# Patient Record
Sex: Female | Born: 1935 | Race: White | Hispanic: No | Marital: Married | Smoking: Never smoker
Health system: Southern US, Community
[De-identification: ages and names within clinical notes are randomized; demographics above are authoritative.]

## PROBLEM LIST (undated history)

## (undated) DIAGNOSIS — I10 Essential (primary) hypertension: Secondary | ICD-10-CM

## (undated) DIAGNOSIS — K219 Gastro-esophageal reflux disease without esophagitis: Secondary | ICD-10-CM

## (undated) DIAGNOSIS — I4510 Unspecified right bundle-branch block: Secondary | ICD-10-CM

## (undated) DIAGNOSIS — K819 Cholecystitis, unspecified: Secondary | ICD-10-CM

---

## 1998-01-30 ENCOUNTER — Other Ambulatory Visit: Admission: RE | Admit: 1998-01-30 | Discharge: 1998-01-30 | Payer: Self-pay | Admitting: Emergency Medicine

## 1999-05-24 ENCOUNTER — Encounter: Admission: RE | Admit: 1999-05-24 | Discharge: 1999-05-24 | Payer: Self-pay | Admitting: Emergency Medicine

## 1999-10-13 ENCOUNTER — Encounter: Payer: Self-pay | Admitting: Emergency Medicine

## 1999-10-13 ENCOUNTER — Encounter: Admission: RE | Admit: 1999-10-13 | Discharge: 1999-10-13 | Payer: Self-pay | Admitting: Emergency Medicine

## 2000-11-28 ENCOUNTER — Encounter: Payer: Self-pay | Admitting: Emergency Medicine

## 2000-11-28 ENCOUNTER — Encounter: Admission: RE | Admit: 2000-11-28 | Discharge: 2000-11-28 | Payer: Self-pay | Admitting: Emergency Medicine

## 2001-12-08 ENCOUNTER — Encounter: Payer: Self-pay | Admitting: Emergency Medicine

## 2001-12-08 ENCOUNTER — Encounter: Admission: RE | Admit: 2001-12-08 | Discharge: 2001-12-08 | Payer: Self-pay | Admitting: Emergency Medicine

## 2002-03-13 ENCOUNTER — Encounter: Admission: RE | Admit: 2002-03-13 | Discharge: 2002-03-13 | Payer: Self-pay | Admitting: Emergency Medicine

## 2002-03-13 ENCOUNTER — Encounter: Payer: Self-pay | Admitting: Emergency Medicine

## 2003-02-04 ENCOUNTER — Encounter: Payer: Self-pay | Admitting: Emergency Medicine

## 2003-02-04 ENCOUNTER — Encounter: Admission: RE | Admit: 2003-02-04 | Discharge: 2003-02-04 | Payer: Self-pay | Admitting: Emergency Medicine

## 2004-03-06 ENCOUNTER — Encounter: Admission: RE | Admit: 2004-03-06 | Discharge: 2004-03-06 | Payer: Self-pay | Admitting: Emergency Medicine

## 2004-10-25 ENCOUNTER — Encounter (INDEPENDENT_AMBULATORY_CARE_PROVIDER_SITE_OTHER): Payer: Self-pay | Admitting: *Deleted

## 2004-10-26 ENCOUNTER — Ambulatory Visit (HOSPITAL_COMMUNITY): Admission: RE | Admit: 2004-10-26 | Discharge: 2004-10-26 | Payer: Self-pay | Admitting: Gastroenterology

## 2005-04-11 ENCOUNTER — Encounter: Admission: RE | Admit: 2005-04-11 | Discharge: 2005-04-11 | Payer: Self-pay | Admitting: Emergency Medicine

## 2006-03-22 ENCOUNTER — Encounter: Admission: RE | Admit: 2006-03-22 | Discharge: 2006-03-22 | Payer: Self-pay | Admitting: Emergency Medicine

## 2006-04-12 ENCOUNTER — Encounter: Admission: RE | Admit: 2006-04-12 | Discharge: 2006-04-12 | Payer: Self-pay | Admitting: Emergency Medicine

## 2007-05-02 ENCOUNTER — Encounter: Admission: RE | Admit: 2007-05-02 | Discharge: 2007-05-02 | Payer: Self-pay | Admitting: Emergency Medicine

## 2008-05-18 ENCOUNTER — Encounter: Admission: RE | Admit: 2008-05-18 | Discharge: 2008-05-18 | Payer: Self-pay | Admitting: Emergency Medicine

## 2009-02-16 ENCOUNTER — Encounter: Admission: RE | Admit: 2009-02-16 | Discharge: 2009-02-16 | Payer: Self-pay | Admitting: Emergency Medicine

## 2009-04-01 ENCOUNTER — Encounter: Admission: RE | Admit: 2009-04-01 | Discharge: 2009-04-01 | Payer: Self-pay | Admitting: Emergency Medicine

## 2009-05-31 ENCOUNTER — Encounter: Admission: RE | Admit: 2009-05-31 | Discharge: 2009-05-31 | Payer: Self-pay | Admitting: Emergency Medicine

## 2010-06-02 ENCOUNTER — Encounter: Admission: RE | Admit: 2010-06-02 | Discharge: 2010-06-02 | Payer: Self-pay | Admitting: Emergency Medicine

## 2010-06-19 ENCOUNTER — Encounter: Admission: RE | Admit: 2010-06-19 | Discharge: 2010-06-19 | Payer: Self-pay | Admitting: Emergency Medicine

## 2010-12-29 NOTE — Op Note (Signed)
Madeline Rodriguez, Madeline Rodriguez NO.:  [id number]   MEDICAL RECORD NO.:  [id number]          PATIENT TYPE:  AMB   LOCATION:  ENDO                         FACILITY:  [facility]   PHYSICIAN:  Graylin Shiver, M.D.   DATE OF BIRTH:  [birth year]   DATE OF PROCEDURE:  10/26/2004  DATE OF DISCHARGE:                                 OPERATIVE REPORT   PROCEDURE PERFORMED:  Colonoscopy with polypectomy.   INDICATIONS FOR PROCEDURE:  Rectal bleeding, family history of colon cancer.   Informed consent was obtained after explanation of the risks of bleeding,  infection and perforation.   PREMEDICATION:  Fentanyl 60 mcg IV, Versed 6 milligrams IV.   PROCEDURE:  With the patient in a left lateral decubitus position, a rectal  exam was performed. No masses were felt. The Olympus colonoscope was  inserted into the rectum and advanced around the colon to the cecum. Cecal  landmarks were identified. The cecum and descending colon were normal.  The  transverse colon was normal. The descending colon and sigmoid revealed  diverticulosis.  In the distal rectum there were two polyps.  One was a 3 mm  polyp removed with the cold snare technique. The other was 1 cm pedunculated  polyp removed by snare cautery technique. The scope was retroflexed in the  rectum.  No other abnormalities were seen.  She tolerated the procedure well  without complications.   IMPRESSION:  1.  Two rectal polyps.  2.  Diverticulosis.   PLAN:  The pathology will be checked.      SFG/MEDQ  D:  10/26/2004  T:  10/26/2004  Job:  [id number]   cc:   Reuben Likes, M.D.  [address]  [place]  [place] [address]  Fax: [phone]

## 2011-05-02 ENCOUNTER — Other Ambulatory Visit: Payer: Self-pay | Admitting: Emergency Medicine

## 2011-05-02 DIAGNOSIS — Z1231 Encounter for screening mammogram for malignant neoplasm of breast: Secondary | ICD-10-CM

## 2011-06-05 ENCOUNTER — Ambulatory Visit
Admission: RE | Admit: 2011-06-05 | Discharge: 2011-06-05 | Disposition: A | Discharge status: Home / Self Care | Payer: Federal, State, Local not specified - PPO | Source: Ambulatory Visit | Attending: Emergency Medicine | Admitting: Emergency Medicine

## 2011-06-05 DIAGNOSIS — Z1231 Encounter for screening mammogram for malignant neoplasm of breast: Secondary | ICD-10-CM

## 2012-05-05 ENCOUNTER — Other Ambulatory Visit: Payer: Self-pay | Admitting: Family Medicine

## 2012-05-05 DIAGNOSIS — Z1231 Encounter for screening mammogram for malignant neoplasm of breast: Secondary | ICD-10-CM

## 2012-06-05 ENCOUNTER — Ambulatory Visit
Admission: RE | Admit: 2012-06-05 | Discharge: 2012-06-05 | Disposition: A | Discharge status: Home / Self Care | Payer: Federal, State, Local not specified - PPO | Source: Ambulatory Visit | Attending: Family Medicine | Admitting: Family Medicine

## 2012-06-05 DIAGNOSIS — Z1231 Encounter for screening mammogram for malignant neoplasm of breast: Secondary | ICD-10-CM

## 2013-01-16 ENCOUNTER — Other Ambulatory Visit: Payer: Self-pay | Admitting: Family Medicine

## 2013-01-16 DIAGNOSIS — R42 Dizziness and giddiness: Secondary | ICD-10-CM

## 2013-01-20 ENCOUNTER — Ambulatory Visit
Admission: RE | Admit: 2013-01-20 | Discharge: 2013-01-20 | Disposition: A | Discharge status: Home / Self Care | Payer: Federal, State, Local not specified - PPO | Source: Ambulatory Visit | Attending: Family Medicine | Admitting: Family Medicine

## 2013-01-20 DIAGNOSIS — R42 Dizziness and giddiness: Secondary | ICD-10-CM

## 2013-01-20 MED ORDER — GADOBENATE DIMEGLUMINE 529 MG/ML IV SOLN
17.0000 mL | Freq: Once | INTRAVENOUS | Status: AC | PRN
  Administered 2013-01-20: 17 mL via INTRAVENOUS

## 2013-05-14 ENCOUNTER — Other Ambulatory Visit: Payer: Self-pay

## 2013-05-14 DIAGNOSIS — Z1231 Encounter for screening mammogram for malignant neoplasm of breast: Secondary | ICD-10-CM

## 2013-06-09 ENCOUNTER — Ambulatory Visit
Admission: RE | Admit: 2013-06-09 | Discharge: 2013-06-09 | Disposition: A | Discharge status: Home / Self Care | Payer: Federal, State, Local not specified - PPO | Source: Ambulatory Visit

## 2013-06-09 DIAGNOSIS — Z1231 Encounter for screening mammogram for malignant neoplasm of breast: Secondary | ICD-10-CM

## 2013-12-04 ENCOUNTER — Encounter (HOSPITAL_COMMUNITY): Payer: Self-pay | Admitting: Emergency Medicine

## 2013-12-04 ENCOUNTER — Inpatient Hospital Stay (HOSPITAL_COMMUNITY)
Admission: EM | Admit: 2013-12-04 | Discharge: 2013-12-07 | DRG: 419 | Disposition: A | Discharge status: Home / Self Care | Payer: Medicare Other | Attending: Internal Medicine | Admitting: Internal Medicine

## 2013-12-04 ENCOUNTER — Emergency Department (HOSPITAL_COMMUNITY): Payer: Medicare Other

## 2013-12-04 DIAGNOSIS — I10 Essential (primary) hypertension: Secondary | ICD-10-CM | POA: Diagnosis present

## 2013-12-04 DIAGNOSIS — K219 Gastro-esophageal reflux disease without esophagitis: Secondary | ICD-10-CM | POA: Diagnosis present

## 2013-12-04 DIAGNOSIS — K8050 Calculus of bile duct without cholangitis or cholecystitis without obstruction: Secondary | ICD-10-CM | POA: Diagnosis present

## 2013-12-04 DIAGNOSIS — Z7982 Long term (current) use of aspirin: Secondary | ICD-10-CM

## 2013-12-04 DIAGNOSIS — Z79899 Other long term (current) drug therapy: Secondary | ICD-10-CM

## 2013-12-04 DIAGNOSIS — Z860100 Personal history of colon polyps, unspecified: Secondary | ICD-10-CM

## 2013-12-04 DIAGNOSIS — I4510 Unspecified right bundle-branch block: Secondary | ICD-10-CM | POA: Diagnosis present

## 2013-12-04 DIAGNOSIS — K8071 Calculus of gallbladder and bile duct without cholecystitis with obstruction: Principal | ICD-10-CM | POA: Diagnosis present

## 2013-12-04 DIAGNOSIS — K8051 Calculus of bile duct without cholangitis or cholecystitis with obstruction: Secondary | ICD-10-CM | POA: Diagnosis present

## 2013-12-04 HISTORY — DX: Essential (primary) hypertension: I10

## 2013-12-04 HISTORY — DX: Cholecystitis, unspecified: K81.9

## 2013-12-04 HISTORY — DX: Gastro-esophageal reflux disease without esophagitis: K21.9

## 2013-12-04 HISTORY — DX: Unspecified right bundle-branch block: I45.10

## 2013-12-04 LAB — CBC WITH DIFFERENTIAL/PLATELET
BASOS ABS: 0 10*3/uL (ref 0.0–0.1)
Basophils Relative: 0 % (ref 0–1)
EOS PCT: 0 % (ref 0–5)
Eosinophils Absolute: 0 10*3/uL (ref 0.0–0.7)
HEMATOCRIT: 42.4 % (ref 36.0–46.0)
HEMOGLOBIN: 13.9 g/dL (ref 12.0–15.0)
Lymphocytes Relative: 1 % — ABNORMAL LOW (ref 12–46)
Lymphs Abs: 0.1 10*3/uL — ABNORMAL LOW (ref 0.7–4.0)
MCH: 28.1 pg (ref 26.0–34.0)
MCHC: 32.8 g/dL (ref 30.0–36.0)
MCV: 85.7 fL (ref 78.0–100.0)
Monocytes Absolute: 0.4 10*3/uL (ref 0.1–1.0)
Monocytes Relative: 4 % (ref 3–12)
Neutro Abs: 10.8 10*3/uL — ABNORMAL HIGH (ref 1.7–7.7)
Neutrophils Relative %: 95 % — ABNORMAL HIGH (ref 43–77)
PLATELETS: 217 10*3/uL (ref 150–400)
RBC: 4.95 MIL/uL (ref 3.87–5.11)
RDW: 13.6 % (ref 11.5–15.5)
WBC: 11.3 10*3/uL — AB (ref 4.0–10.5)

## 2013-12-04 LAB — COMPREHENSIVE METABOLIC PANEL
ALBUMIN: 4.1 g/dL (ref 3.5–5.2)
ALK PHOS: 168 U/L — AB (ref 39–117)
ALT: 642 U/L — AB (ref 0–35)
AST: 966 U/L — AB (ref 0–37)
BUN: 21 mg/dL (ref 6–23)
CO2: 22 meq/L (ref 19–32)
Calcium: 10.1 mg/dL (ref 8.4–10.5)
Chloride: 97 mEq/L (ref 96–112)
Creatinine, Ser: 0.8 mg/dL (ref 0.50–1.10)
GFR calc non Af Amer: 69 mL/min — ABNORMAL LOW (ref >90)
GFR, EST AFRICAN AMERICAN: 80 mL/min — AB (ref >90)
Glucose, Bld: 244 mg/dL — ABNORMAL HIGH (ref 70–99)
POTASSIUM: 3.5 meq/L — AB (ref 3.7–5.3)
SODIUM: 136 meq/L — AB (ref 137–147)
Total Bilirubin: 2.7 mg/dL — ABNORMAL HIGH (ref 0.3–1.2)
Total Protein: 7.3 g/dL (ref 6.0–8.3)

## 2013-12-04 LAB — URINALYSIS, ROUTINE W REFLEX MICROSCOPIC
Bilirubin Urine: NEGATIVE
GLUCOSE, UA: 500 mg/dL — AB
Hgb urine dipstick: NEGATIVE
Ketones, ur: 40 mg/dL — AB
LEUKOCYTES UA: NEGATIVE
NITRITE: NEGATIVE
PH: 6.5 (ref 5.0–8.0)
Protein, ur: NEGATIVE mg/dL
SPECIFIC GRAVITY, URINE: 1.017 (ref 1.005–1.030)
UROBILINOGEN UA: 0.2 mg/dL (ref 0.0–1.0)

## 2013-12-04 LAB — GLUCOSE, CAPILLARY: GLUCOSE-CAPILLARY: 105 mg/dL — AB (ref 70–99)

## 2013-12-04 LAB — I-STAT TROPONIN, ED: TROPONIN I, POC: 0.01 ng/mL (ref 0.00–0.08)

## 2013-12-04 LAB — LIPASE, BLOOD: Lipase: 19 U/L (ref 11–59)

## 2013-12-04 MED ORDER — ONDANSETRON HCL 4 MG/2ML IJ SOLN
4.0000 mg | Freq: Once | INTRAMUSCULAR | Status: AC
  Administered 2013-12-04: 4 mg via INTRAVENOUS
  Filled 2013-12-04: qty 2

## 2013-12-04 MED ORDER — PANTOPRAZOLE SODIUM 40 MG PO TBEC
40.0000 mg | DELAYED_RELEASE_TABLET | Freq: Every day | ORAL | Status: DC
Start: 2013-12-04 — End: 2013-12-07
  Administered 2013-12-04 – 2013-12-07 (×4): 40 mg via ORAL
  Filled 2013-12-04 (×4): qty 1

## 2013-12-04 MED ORDER — SODIUM CHLORIDE 0.9 % IV SOLN
INTRAVENOUS | Status: DC
  Administered 2013-12-05 – 2013-12-06 (×2): via INTRAVENOUS

## 2013-12-04 MED ORDER — MORPHINE SULFATE 2 MG/ML IJ SOLN
2.0000 mg | INTRAMUSCULAR | Status: DC | PRN
  Administered 2013-12-05 – 2013-12-06 (×4): 2 mg via INTRAVENOUS
  Filled 2013-12-04 (×4): qty 1

## 2013-12-04 MED ORDER — ONDANSETRON HCL 4 MG/2ML IJ SOLN
4.0000 mg | Freq: Four times a day (QID) | INTRAMUSCULAR | Status: DC | PRN
  Administered 2013-12-05: 4 mg via INTRAVENOUS
  Filled 2013-12-04: qty 2

## 2013-12-04 MED ORDER — ACETAMINOPHEN 325 MG PO TABS
650.0000 mg | ORAL_TABLET | Freq: Four times a day (QID) | ORAL | Status: DC | PRN
  Administered 2013-12-05: 650 mg via ORAL
  Filled 2013-12-04: qty 2

## 2013-12-04 MED ORDER — AMLODIPINE BESYLATE 2.5 MG PO TABS
2.5000 mg | ORAL_TABLET | Freq: Every day | ORAL | Status: DC
  Administered 2013-12-05 – 2013-12-07 (×3): 2.5 mg via ORAL
  Filled 2013-12-04 (×4): qty 1

## 2013-12-04 MED ORDER — SODIUM CHLORIDE 0.9 % IV SOLN
INTRAVENOUS | Status: AC
  Administered 2013-12-04: 15:00:00 via INTRAVENOUS

## 2013-12-04 MED ORDER — MORPHINE SULFATE 4 MG/ML IJ SOLN
4.0000 mg | Freq: Once | INTRAMUSCULAR | Status: AC
  Administered 2013-12-04: 4 mg via INTRAVENOUS
  Filled 2013-12-04: qty 1

## 2013-12-04 MED ORDER — SODIUM CHLORIDE 0.9 % IV SOLN: INTRAVENOUS | Status: DC

## 2013-12-04 MED ORDER — ACETAMINOPHEN 500 MG PO TABS: 500.0000 mg | ORAL_TABLET | Freq: Every evening | ORAL | Status: DC | PRN

## 2013-12-04 MED ORDER — DEXTROSE 5 % IV SOLN
1.0000 g | INTRAVENOUS | Status: DC
  Administered 2013-12-04 – 2013-12-05 (×3): 1 g via INTRAVENOUS
  Filled 2013-12-04 (×4): qty 10

## 2013-12-04 MED ORDER — ENOXAPARIN SODIUM 40 MG/0.4ML ~~LOC~~ SOLN
40.0000 mg | SUBCUTANEOUS | Status: DC
  Administered 2013-12-04 – 2013-12-06 (×3): 40 mg via SUBCUTANEOUS
  Filled 2013-12-04 (×4): qty 0.4

## 2013-12-04 MED ORDER — SODIUM CHLORIDE 0.9 % IV BOLUS (SEPSIS)
1000.0000 mL | Freq: Once | INTRAVENOUS | Status: AC
  Administered 2013-12-04: 1000 mL via INTRAVENOUS

## 2013-12-04 MED ORDER — ADULT MULTIVITAMIN W/MINERALS CH
1.0000 | ORAL_TABLET | Freq: Every day | ORAL | Status: DC
  Administered 2013-12-04 – 2013-12-07 (×4): 1 via ORAL
  Filled 2013-12-04 (×4): qty 1

## 2013-12-04 MED ORDER — ONDANSETRON HCL 4 MG/2ML IJ SOLN: 4.0000 mg | Freq: Four times a day (QID) | INTRAMUSCULAR | Status: DC | PRN

## 2013-12-04 NOTE — ED Notes (Signed)
Bed: HF29 Expected date:  Expected time:  Means of arrival:  Comments: EMS-epigastric pain

## 2013-12-04 NOTE — Consult Note (Signed)
[facility] CONSULT Reason for consult: possible common bile but stone Referring Physician: ER. PCP: Dr. Sandi Mariscal. Primary G.I.: Dr Georgia Duff is an 80 y.o. female.  HPI: patient admitted with severe upper abdominal pain that began yesterday evening after eating a grilled chicken sandwich. She apparently has a history of gnome gallstones but for unclear reasons has not had cholecystectomy. Ultrasound reveal multiple gallstones without thickening in a dilated common bile duct of 13 mm suspicious for obstructed distal common bile duct due to CBD stone. Stone could not clearly be seen on the ultrasound. Labs remarkable for slightly elevated WBC 11.3 and marked elevation of liver tests transaminases 602,000, alk phos 168 total bilirubin 2.7. Patient reports that she clinically feels better after receiving pain medication in the ER. She has had previous colonoscopy with the removal of colon polyps reports that the procedure was uncomfortable and did not choose to have it repeated although it was recommended to her to repeat it. Probably medical problems are hypertension and a history of a right bundle branch block.  Past Medical History  Diagnosis Date  . Hypertension   . Acid reflux   . Cholecystitis   . Bundle branch block, right     History reviewed. No pertinent past surgical history.  Family History  Problem Relation Age of Onset  . Myasthenia gravis Mother   . Other Father     old age    Social History:  reports that she has never smoked. She has never used smokeless tobacco. She reports that she does not drink alcohol or use illicit drugs.  Allergies:  Allergies  Allergen Reactions  . Tape Other (See Comments)    Only use paper     Medications; Prior to Admission medications   Medication Sig Start Date End Date Taking? Authorizing Provider  acetaminophen (TYLENOL) 500 MG tablet Take 500 mg by mouth at bedtime as needed (For pain).    Yes Historical  Provider, MD  amLODipine (NORVASC) 5 MG tablet Take 2.5 mg by mouth 4 (four) times daily.   Yes Historical Provider, MD  aspirin 81 MG tablet Take 81 mg by mouth every evening.   Yes Historical Provider, MD  Calcium-Vitamin D (CALTRATE 600 PLUS-VIT D PO) Take 2 tablets by mouth daily.   Yes Historical Provider, MD  esomeprazole (NEXIUM) 20 MG capsule Take 20 mg by mouth daily at 12 noon.   Yes Historical Provider, MD  glucosamine-chondroitin 500-400 MG tablet Take 2 tablets by mouth daily.   Yes Historical Provider, MD  Multiple Vitamin (MULTIVITAMIN WITH MINERALS) TABS tablet Take 1 tablet by mouth daily.   Yes Historical Provider, MD  Omega 3 1200 MG CAPS Take 1,200 mg by mouth daily.   Yes Historical Provider, MD  psyllium (REGULOID) 0.52 G capsule Take 4 capsules by mouth daily.   Yes Historical Provider, MD    PRN Meds    Results for orders placed during the hospital encounter of 12/04/13 (from the past 48 hour(s))  URINALYSIS, ROUTINE W REFLEX MICROSCOPIC     Status: Abnormal   Collection Time    12/04/13  8:36 AM      Result Value Ref Range   Color, Urine YELLOW  YELLOW   APPearance CLEAR  CLEAR   Specific Gravity, Urine 1.017  1.005 - 1.030   pH 6.5  5.0 - 8.0   Glucose, UA 500 (*) NEGATIVE mg/dL   Hgb urine dipstick NEGATIVE  NEGATIVE   Bilirubin Urine NEGATIVE  NEGATIVE   Ketones, ur 40 (*) NEGATIVE mg/dL   Protein, ur NEGATIVE  NEGATIVE mg/dL   Urobilinogen, UA 0.2  0.0 - 1.0 mg/dL   Nitrite NEGATIVE  NEGATIVE   Leukocytes, UA NEGATIVE  NEGATIVE   Comment: MICROSCOPIC NOT DONE ON URINES WITH NEGATIVE PROTEIN, BLOOD, LEUKOCYTES, NITRITE, OR GLUCOSE <1000 mg/dL.  CBC WITH DIFFERENTIAL     Status: Abnormal   Collection Time    12/04/13  8:38 AM      Result Value Ref Range   WBC 11.3 (*) 4.0 - 10.5 K/uL   RBC 4.95  3.87 - 5.11 MIL/uL   Hemoglobin 13.9  12.0 - 15.0 g/dL   HCT 42.4  36.0 - 46.0 %   MCV 85.7  78.0 - 100.0 fL   MCH 28.1  26.0 - 34.0 pg   MCHC 32.8  30.0 -  36.0 g/dL   RDW 13.6  11.5 - 15.5 %   Platelets 217  150 - 400 K/uL   Neutrophils Relative % 95 (*) 43 - 77 %   Neutro Abs 10.8 (*) 1.7 - 7.7 K/uL   Lymphocytes Relative 1 (*) 12 - 46 %   Lymphs Abs 0.1 (*) 0.7 - 4.0 K/uL   Monocytes Relative 4  3 - 12 %   Monocytes Absolute 0.4  0.1 - 1.0 K/uL   Eosinophils Relative 0  0 - 5 %   Eosinophils Absolute 0.0  0.0 - 0.7 K/uL   Basophils Relative 0  0 - 1 %   Basophils Absolute 0.0  0.0 - 0.1 K/uL  COMPREHENSIVE METABOLIC PANEL     Status: Abnormal   Collection Time    12/04/13  8:38 AM      Result Value Ref Range   Sodium 136 (*) 137 - 147 mEq/L   Potassium 3.5 (*) 3.7 - 5.3 mEq/L   Chloride 97  96 - 112 mEq/L   CO2 22  19 - 32 mEq/L   Glucose, Bld 244 (*) 70 - 99 mg/dL   BUN 21  6 - 23 mg/dL   Creatinine, Ser 0.80  0.50 - 1.10 mg/dL   Calcium 10.1  8.4 - 10.5 mg/dL   Total Protein 7.3  6.0 - 8.3 g/dL   Albumin 4.1  3.5 - 5.2 g/dL   AST 966 (*) 0 - 37 U/L   ALT 642 (*) 0 - 35 U/L   Alkaline Phosphatase 168 (*) 39 - 117 U/L   Total Bilirubin 2.7 (*) 0.3 - 1.2 mg/dL   GFR calc non Af Amer 69 (*) >90 mL/min   GFR calc Af Amer 80 (*) >90 mL/min   Comment: (NOTE)     The eGFR has been calculated using the CKD EPI equation.     This calculation has not been validated in all clinical situations.     eGFR's persistently <90 mL/min signify possible Chronic Kidney     Disease.  LIPASE, BLOOD     Status: None   Collection Time    12/04/13  8:38 AM      Result Value Ref Range   Lipase 19  11 - 59 U/L  I-STAT TROPOININ, ED     Status: None   Collection Time    12/04/13  8:48 AM      Result Value Ref Range   Troponin i, poc 0.01  0.00 - 0.08 ng/mL   Comment 3            Comment: Due to the  release kinetics of cTnI,     a negative result within the first hours     of the onset of symptoms does not rule out     myocardial infarction with certainty.     If myocardial infarction is still suspected,     repeat the test at appropriate  intervals.    US Abdomen Complete  12/04/2013   CLINICAL DATA:  Abdominal pain  EXAM: ULTRASOUND ABDOMEN COMPLETE  COMPARISON:  06/19/2010  FINDINGS: Gallbladder:  Multiple gallstones are identified. No gallbladder wall thickening is noted. A negative sonographic Percell Miller sign is noted.  Common bile duct:  Diameter: 13 mm  Liver:  No focal lesion identified. Within normal limits in parenchymal echogenicity.  IVC:  No abnormality visualized.  Pancreas:  Visualized portion unremarkable.  Spleen:  Size and appearance within normal limits.  Right Kidney:  Length: 10.6 cm. A 1.2 cm parapelvic cyst is seen. Echogenicity within normal limits. No mass or hydronephrosis visualized.  Left Kidney:  Length: 11.6 cm.  Mild hydronephrosis is noted.  Abdominal aorta:  No aneurysm visualized.  Other findings:  Ureteral jet is noted on the left.  IMPRESSION: Cholelithiasis.  Dilated common bile duct which may be related to a distal common bile duct stone. Further evaluation is recommended.  Left-sided hydronephrosis which persists following voiding. A left ureteral jet is noted. This is of uncertain etiology.   Electronically Signed   By: Inez Catalina M.D.   On: 12/04/2013 09:39              Blood pressure 111/59, pulse 87, temperature 98.1 F (36.7 C), temperature source Oral, resp. rate 14, SpO2 94.00%.  Physical exam:   General-- talkative white female in no distress Heart-- regular rate and rhythm without murmurs are gallops Lungs--clear Abdomen-- positive bowel sounds mild if any epigastric tenderness   Assessment: 1. Cholelithiasis probable biliary colic. Ultrasound and labs very suspicious for CBD stone. Feel patient should have laparoscopic cholecystectomy with preoperative ERCP. 2. History of colon polyps. She has declined repeat colonoscopy  Plan: 1. Will plan ERCP with probable stone extraction in the morning by Dr. Watt Climes at 730 with propofol anesthesia. Have explained all this to the patient  her husband including the consequences of leaving common bile duct stone and gallstones in place and the risk of future problems. The procedural risk of bleeding and pancreatitis approximately 2 to 3% were discussed. The patient is agreeable to proceed in the morning. We will go ahead and give her process and 1 g IV Q. 24 hours and would recommend keeping her on ice chips only for now. 2. Would go ahead and obtain surgical consult. Hopefully we will be able to proceed with laparoscopic cholecystectomy on the submission.   Winfield Cunas. 12/04/2013, 11:56 AM

## 2013-12-04 NOTE — ED Notes (Signed)
Ultrasound at bedside

## 2013-12-04 NOTE — H&P (Signed)
[facility] History and Physical  Madeline Rodriguez ZOX:[id number] DOB: 07/26/1934 DOA: 12/04/2013  Referring physician: EDP PCP: Birdena Crandall, MD   Chief Complaint: Abd pain  HPI: Madeline Rodriguez is a 80 y.o. female with PMH of HTN, RBBB, presents to the ER with the above complaints. She's had long standing intermittent epigastric abd pain which is usually self limiting. But yesterday she ate a chicken sandwich and subsequently started experiencing severe epigastric and R sided and pain, which was much more severe and lasted for about 12 hours and hence decided to come to the ER today. No diarrhea or vomiting, no fevers or chills. In ER, noted to have Cholelithiasis, dilated CBD with suspected CBD stone  Review of Systems:  Constitutional:  No weight loss, night sweats, Fevers, chills, fatigue.  HEENT:  No headaches, Difficulty swallowing,Tooth/dental problems,Sore throat,  No sneezing, itching, ear ache, nasal congestion, post nasal drip,  Cardio-vascular:  No chest pain, Orthopnea, PND, swelling in lower extremities, anasarca, dizziness, palpitations  GI:  No heartburn, indigestion, abdominal pain, nausea, vomiting, diarrhea, change in bowel habits, loss of appetite  Resp:  No shortness of breath with exertion or at rest. No excess mucus, no productive cough, No non-productive cough, No coughing up of blood.No change in color of mucus.No wheezing.No chest wall deformity  Skin:  no rash or lesions.  GU:  no dysuria, change in color of urine, no urgency or frequency. No flank pain.  Musculoskeletal:  No joint pain or swelling. No decreased range of motion. No back pain.  Psych:  No change in mood or affect. No depression or anxiety. No memory loss.   Past Medical History  Diagnosis Date  . Hypertension   . Acid reflux   . Cholecystitis   . Bundle branch block, right    History reviewed. No pertinent past surgical history. Social History:  reports that  she has never smoked. She has never used smokeless tobacco. She reports that she does not drink alcohol or use illicit drugs.  Allergies  Allergen Reactions  . Tape Other (See Comments)    Only use paper     Family History  Problem Relation Age of Onset  . Myasthenia gravis Mother   . Other Father     old age     Prior to Admission medications   Medication Sig Start Date End Date Taking? Authorizing Provider  acetaminophen (TYLENOL) 500 MG tablet Take 500 mg by mouth at bedtime as needed (For pain).    Yes Historical Provider, MD  amLODipine (NORVASC) 5 MG tablet Take 2.5 mg by mouth 4 (four) times daily.   Yes Historical Provider, MD  aspirin 81 MG tablet Take 81 mg by mouth every evening.   Yes Historical Provider, MD  Calcium-Vitamin D (CALTRATE 600 PLUS-VIT D PO) Take 2 tablets by mouth daily.   Yes Historical Provider, MD  esomeprazole (NEXIUM) 20 MG capsule Take 20 mg by mouth daily at 12 noon.   Yes Historical Provider, MD  glucosamine-chondroitin 500-400 MG tablet Take 2 tablets by mouth daily.   Yes Historical Provider, MD  Multiple Vitamin (MULTIVITAMIN WITH MINERALS) TABS tablet Take 1 tablet by mouth daily.   Yes Historical Provider, MD  Omega 3 1200 MG CAPS Take 1,200 mg by mouth daily.   Yes Historical Provider, MD  psyllium (REGULOID) 0.52 G capsule Take 4 capsules by mouth daily.   Yes Historical Provider, MD   Physical Exam: Filed Vitals:   12/04/13 1137  BP: 118/62  Pulse: 90  Temp: 98.5 F (36.9 C)  Resp: 18    BP 118/62  Pulse 90  Temp(Src) 98.5 F (36.9 C) (Oral)  Resp 18  Ht 5\' 6"  (1.676 m)  Wt 86.093 kg (189 lb 12.8 oz)  BMI 30.65 kg/m2  SpO2 94%  General:  Appears calm and comfortable Eyes: PERRL, normal lids, irises & conjunctiva ENT: grossly normal hearing, lips & tongue Neck: no LAD, masses or thyromegaly Cardiovascular: RRR, no m/r/g. No LE edema. Respiratory: CTA bilaterally, no w/r/r. Normal respiratory effort. Abdomen: soft, NT, ND,  BS present Skin: no rash or induration seen on limited exam Musculoskeletal: grossly normal tone BUE/BLE Psychiatric: grossly normal mood and affect, speech fluent and appropriate Neurologic: grossly non-focal.          Labs on Admission:  Basic Metabolic Panel:  Recent Labs Lab 12/04/13 0838  NA 136*  K 3.5*  CL 97  CO2 22  GLUCOSE 244*  BUN 21  CREATININE 0.80  CALCIUM 10.1   Liver Function Tests:  Recent Labs Lab 12/04/13 0838  AST 966*  ALT 642*  ALKPHOS 168*  BILITOT 2.7*  PROT 7.3  ALBUMIN 4.1    Recent Labs Lab 12/04/13 0838  LIPASE 19   No results found for this basename: AMMONIA,  in the last 168 hours CBC:  Recent Labs Lab 12/04/13 0838  WBC 11.3*  NEUTROABS 10.8*  HGB 13.9  HCT 42.4  MCV 85.7  PLT 217   Cardiac Enzymes: No results found for this basename: CKTOTAL, CKMB, CKMBINDEX, TROPONINI,  in the last 168 hours  BNP (last 3 results) No results found for this basename: PROBNP,  in the last 8760 hours CBG: No results found for this basename: GLUCAP,  in the last 168 hours  Radiological Exams on Admission: US Abdomen Complete  12/04/2013   CLINICAL DATA:  Abdominal pain  EXAM: ULTRASOUND ABDOMEN COMPLETE  COMPARISON:  06/19/2010  FINDINGS: Gallbladder:  Multiple gallstones are identified. No gallbladder wall thickening is noted. A negative sonographic Percell Miller sign is noted.  Common bile duct:  Diameter: 13 mm  Liver:  No focal lesion identified. Within normal limits in parenchymal echogenicity.  IVC:  No abnormality visualized.  Pancreas:  Visualized portion unremarkable.  Spleen:  Size and appearance within normal limits.  Right Kidney:  Length: 10.6 cm. A 1.2 cm parapelvic cyst is seen. Echogenicity within normal limits. No mass or hydronephrosis visualized.  Left Kidney:  Length: 11.6 cm.  Mild hydronephrosis is noted.  Abdominal aorta:  No aneurysm visualized.  Other findings:  Ureteral jet is noted on the left.  IMPRESSION:  Cholelithiasis.  Dilated common bile duct which may be related to a distal common bile duct stone. Further evaluation is recommended.  Left-sided hydronephrosis which persists following voiding. A left ureteral jet is noted. This is of uncertain etiology.   Electronically Signed   By: Inez Catalina M.D.   On: 12/04/2013 09:39    EKG: NSR, no RBBB  Assessment/Plan    1. Cholelithiasis with suspected choledocholithiasis/CBD obstruction   -for ERCP in am   -lipase normal   -will need cholecystectomy as well   -Appreciate GI consult   -IVF, ice chips for now     2. HTN   -continue norvasc     3. RBBB   -chronic, stable  DVT proph: lovenox  Code Status: Full Code Family Communication: d/w spouse at bedside Disposition Plan: inpatient  Time spent: 6min  Deann Mclaine [facility] Pager [phone]

## 2013-12-04 NOTE — ED Notes (Addendum)
Pt states she has had hx of epigastric pain 5 years, worse since last night 7pm. Pt denies sob, dizziness. Pt states only other symptom weakness. EMs reports pt threw up prior to arrival. No blood in emesis. EMS took 12 lead which showed R BBB, pt states she has hx of same. Pt has hx of gallbladder problems.

## 2013-12-04 NOTE — ED Provider Notes (Addendum)
CSN: [id number]     Arrival date & time 12/04/13  0809 History   First MD Initiated Contact with Patient 12/04/13 [phone]     Chief Complaint  Patient presents with  . Abdominal Pain  . Emesis     (Consider location/radiation/quality/duration/timing/severity/associated sxs/prior Treatment) Patient is a 80 y.o. female presenting with abdominal pain and vomiting. The history is provided by the patient.  Abdominal Pain Pain location:  Epigastric and RUQ Pain quality: aching, gnawing and sharp   Pain radiates to:  Does not radiate Pain severity:  Severe Onset quality:  Gradual Duration:  12 hours Timing:  Constant Progression:  Unchanged Chronicity:  Recurrent Context comment:  States hx of gallstones with intermittent pain regularly but persistent pain since 7pm last night after she had a salad and grilled chicken sandwich Relieved by:  Nothing Worsened by:  Nothing tried Ineffective treatments:  Acetaminophen and eating Associated symptoms: anorexia, nausea and vomiting   Associated symptoms: no chest pain, no chills, no diarrhea, no dysuria and no fever   Risk factors: being elderly   Risk factors: no alcohol abuse, has not had multiple surgeries, no NSAID use and no recent hospitalization   Emesis Associated symptoms: abdominal pain   Associated symptoms: no chills and no diarrhea     Past Medical History  Diagnosis Date  . Hypertension   . Acid reflux   . Cholecystitis    History reviewed. No pertinent past surgical history. History reviewed. No pertinent family history. History  Substance Use Topics  . Smoking status: Never Smoker   . Smokeless tobacco: Not on file  . Alcohol Use: No   OB History   Grav Para Term Preterm Abortions TAB SAB Ect Mult Living                 Review of Systems  Constitutional: Negative for fever and chills.  Cardiovascular: Negative for chest pain.  Gastrointestinal: Positive for nausea, vomiting, abdominal pain and anorexia.  Negative for diarrhea.  Genitourinary: Negative for dysuria.  All other systems reviewed and are negative.     Allergies  Review of patient's allergies indicates no known allergies.  Home Medications   Prior to Admission medications   Not on File   BP 144/85  Pulse 106  Temp(Src) 98.1 F (36.7 C) (Oral)  Resp 16  SpO2 95% Physical Exam  Nursing note and vitals reviewed. Constitutional: She is oriented to person, place, and time. She appears well-developed and well-nourished. She appears distressed.  Appears uncomfortable  HENT:  Head: Normocephalic and atraumatic.  Eyes: EOM are normal. Pupils are equal, round, and reactive to light.  Cardiovascular: Regular rhythm, normal heart sounds and intact distal pulses.  Tachycardia present.  Exam reveals no friction rub.   No murmur heard. Pulmonary/Chest: Effort normal and breath sounds normal. She has no wheezes. She has no rales.  Abdominal: Soft. Normal appearance and bowel sounds are normal. She exhibits no distension. There is tenderness in the right upper quadrant and epigastric area. There is positive Murphy's sign. There is no rebound and no guarding.  Musculoskeletal: Normal range of motion. She exhibits no tenderness.  No edema  Neurological: She is alert and oriented to person, place, and time. No cranial nerve deficit.  Skin: Skin is warm and dry. No rash noted.  Psychiatric: She has a normal mood and affect. Her behavior is normal.    ED Course  Procedures (including critical care time) Labs Review Labs Reviewed  CBC WITH DIFFERENTIAL -  Abnormal; Notable for the following:    WBC 11.3 (*)    Neutrophils Relative % 95 (*)    Neutro Abs 10.8 (*)    Lymphocytes Relative 1 (*)    Lymphs Abs 0.1 (*)    All other components within normal limits  COMPREHENSIVE METABOLIC PANEL - Abnormal; Notable for the following:    Sodium 136 (*)    Potassium 3.5 (*)    Glucose, Bld 244 (*)    AST 966 (*)    ALT 642 (*)     Alkaline Phosphatase 168 (*)    Total Bilirubin 2.7 (*)    GFR calc non Af Amer 69 (*)    GFR calc Af Amer 80 (*)    All other components within normal limits  URINALYSIS, ROUTINE W REFLEX MICROSCOPIC - Abnormal; Notable for the following:    Glucose, UA 500 (*)    Ketones, ur 40 (*)    All other components within normal limits  LIPASE, BLOOD  I-STAT TROPOININ, ED    Imaging Review US Abdomen Complete  12/04/2013   CLINICAL DATA:  Abdominal pain  EXAM: ULTRASOUND ABDOMEN COMPLETE  COMPARISON:  06/19/2010  FINDINGS: Gallbladder:  Multiple gallstones are identified. No gallbladder wall thickening is noted. A negative sonographic Percell Miller sign is noted.  Common bile duct:  Diameter: 13 mm  Liver:  No focal lesion identified. Within normal limits in parenchymal echogenicity.  IVC:  No abnormality visualized.  Pancreas:  Visualized portion unremarkable.  Spleen:  Size and appearance within normal limits.  Right Kidney:  Length: 10.6 cm. A 1.2 cm parapelvic cyst is seen. Echogenicity within normal limits. No mass or hydronephrosis visualized.  Left Kidney:  Length: 11.6 cm.  Mild hydronephrosis is noted.  Abdominal aorta:  No aneurysm visualized.  Other findings:  Ureteral jet is noted on the left.  IMPRESSION: Cholelithiasis.  Dilated common bile duct which may be related to a distal common bile duct stone. Further evaluation is recommended.  Left-sided hydronephrosis which persists following voiding. A left ureteral jet is noted. This is of uncertain etiology.   Electronically Signed   By: Inez Catalina M.D.   On: 12/04/2013 09:39     Date: 12/04/2013  Rate: 102  Rhythm: sinus tachycardia  QRS Axis: normal  Intervals: normal  ST/T Wave abnormalities: nonspecific ST/T changes  Conduction Disutrbances:right bundle branch block  Narrative Interpretation:   Old EKG Reviewed: none available    MDM   Final diagnoses:  Choledocholithiasis with obstruction    Patient 12 hours of epigastric/right  upper quadrant pain with one episode of vomiting. She has a history of gallstones in the past but has not had anything done about it. She gets intermittent pain regularly but never as persistent as now. Patient denies any shortness of breath or chest pain. She was afebrile here but appears uncomfortable. On exam she has epigastric and right upper quadrant tenderness with a positive Murphy sign. She denies any urinary symptoms.  With likely patient has cholecystitis based on history and exam findings. CBC, CMP, lipase, UA, EKG pending.  Patient given IV fluids, pain and nausea medication  9:23 AM Leukocytosis of 11,000 and elevated LFTs with elevation of t-bili.  Lipase wnl.  U/s pending.  9:52 AM Cholelithiasis.  Dilated CBD to 7mm with concern for stone.  Left hydronephrosis of uncertain etiology and labs appear possible diabetes with elevated BS to 244 and 500 glucose in urine.  Will discuss with [facility].  Pt states pain  is much improved now.  10:03 AM Spoke with Dr. Oletta Lamas who will see the pt.  Blanchie Dessert, MD 12/04/13 1003  Blanchie Dessert, MD 12/04/13 1014

## 2013-12-05 ENCOUNTER — Inpatient Hospital Stay (HOSPITAL_COMMUNITY): Payer: Medicare Other

## 2013-12-05 ENCOUNTER — Encounter (HOSPITAL_COMMUNITY): Payer: Medicare Other | Admitting: Anesthesiology

## 2013-12-05 ENCOUNTER — Inpatient Hospital Stay (HOSPITAL_COMMUNITY): Payer: Medicare Other | Admitting: Anesthesiology

## 2013-12-05 ENCOUNTER — Encounter (HOSPITAL_COMMUNITY)
Admission: EM | Disposition: A | Discharge status: Home / Self Care | Payer: Self-pay | Source: Home / Self Care | Attending: Internal Medicine

## 2013-12-05 ENCOUNTER — Encounter (HOSPITAL_COMMUNITY): Payer: Self-pay | Admitting: Anesthesiology

## 2013-12-05 DIAGNOSIS — K8050 Calculus of bile duct without cholangitis or cholecystitis without obstruction: Secondary | ICD-10-CM

## 2013-12-05 DIAGNOSIS — K8020 Calculus of gallbladder without cholecystitis without obstruction: Secondary | ICD-10-CM

## 2013-12-05 DIAGNOSIS — R1013 Epigastric pain: Secondary | ICD-10-CM

## 2013-12-05 HISTORY — PX: ERCP: SHX5425

## 2013-12-05 LAB — CBC
HEMATOCRIT: 39.8 % (ref 36.0–46.0)
HEMOGLOBIN: 13.1 g/dL (ref 12.0–15.0)
MCH: 28.7 pg (ref 26.0–34.0)
MCHC: 32.9 g/dL (ref 30.0–36.0)
MCV: 87.3 fL (ref 78.0–100.0)
Platelets: 141 10*3/uL — ABNORMAL LOW (ref 150–400)
RBC: 4.56 MIL/uL (ref 3.87–5.11)
RDW: 14.6 % (ref 11.5–15.5)
WBC: 13 10*3/uL — AB (ref 4.0–10.5)

## 2013-12-05 LAB — COMPREHENSIVE METABOLIC PANEL
ALT: 357 U/L — AB (ref 0–35)
AST: 262 U/L — ABNORMAL HIGH (ref 0–37)
Albumin: 3.3 g/dL — ABNORMAL LOW (ref 3.5–5.2)
Alkaline Phosphatase: 130 U/L — ABNORMAL HIGH (ref 39–117)
BUN: 19 mg/dL (ref 6–23)
CO2: 24 mEq/L (ref 19–32)
Calcium: 9.3 mg/dL (ref 8.4–10.5)
Chloride: 105 mEq/L (ref 96–112)
Creatinine, Ser: 0.88 mg/dL (ref 0.50–1.10)
GFR calc non Af Amer: 62 mL/min — ABNORMAL LOW (ref >90)
GFR, EST AFRICAN AMERICAN: 72 mL/min — AB (ref >90)
Glucose, Bld: 83 mg/dL (ref 70–99)
Potassium: 4 mEq/L (ref 3.7–5.3)
SODIUM: 142 meq/L (ref 137–147)
TOTAL PROTEIN: 6.1 g/dL (ref 6.0–8.3)
Total Bilirubin: 2.6 mg/dL — ABNORMAL HIGH (ref 0.3–1.2)

## 2013-12-05 SURGERY — ERCP, WITH INTERVENTION IF INDICATED
Anesthesia: Monitor Anesthesia Care

## 2013-12-05 SURGERY — ERCP, WITH INTERVENTION IF INDICATED
Anesthesia: General

## 2013-12-05 MED ORDER — LIDOCAINE HCL (CARDIAC) 20 MG/ML IV SOLN
INTRAVENOUS | Status: DC | PRN
  Administered 2013-12-05: 100 mg via INTRAVENOUS

## 2013-12-05 MED ORDER — FENTANYL CITRATE 0.05 MG/ML IJ SOLN: 25.0000 ug | INTRAMUSCULAR | Status: DC | PRN

## 2013-12-05 MED ORDER — ONDANSETRON HCL 4 MG/2ML IJ SOLN
INTRAMUSCULAR | Status: DC | PRN
  Administered 2013-12-05: 4 mg via INTRAVENOUS

## 2013-12-05 MED ORDER — DEXAMETHASONE SODIUM PHOSPHATE 10 MG/ML IJ SOLN
INTRAMUSCULAR | Status: AC
  Filled 2013-12-05: qty 1

## 2013-12-05 MED ORDER — SUCCINYLCHOLINE CHLORIDE 20 MG/ML IJ SOLN
INTRAMUSCULAR | Status: DC | PRN
  Administered 2013-12-05: 100 mg via INTRAVENOUS

## 2013-12-05 MED ORDER — PROPOFOL 10 MG/ML IV BOLUS
INTRAVENOUS | Status: DC | PRN
  Administered 2013-12-05: 50 mg via INTRAVENOUS
  Administered 2013-12-05: 150 mg via INTRAVENOUS

## 2013-12-05 MED ORDER — FENTANYL CITRATE 0.05 MG/ML IJ SOLN
INTRAMUSCULAR | Status: AC
  Filled 2013-12-05: qty 2

## 2013-12-05 MED ORDER — LIDOCAINE HCL (CARDIAC) 20 MG/ML IV SOLN
INTRAVENOUS | Status: AC
  Filled 2013-12-05: qty 5

## 2013-12-05 MED ORDER — MEPERIDINE HCL 50 MG/ML IJ SOLN: 6.2500 mg | INTRAMUSCULAR | Status: DC | PRN

## 2013-12-05 MED ORDER — GLUCAGON HCL (RDNA) 1 MG IJ SOLR
INTRAMUSCULAR | Status: AC
  Filled 2013-12-05: qty 2

## 2013-12-05 MED ORDER — METOCLOPRAMIDE HCL 5 MG/ML IJ SOLN
10.0000 mg | Freq: Once | INTRAMUSCULAR | Status: DC | PRN
Start: 2013-12-05 — End: 2013-12-05
  Filled 2013-12-05: qty 2

## 2013-12-05 MED ORDER — SODIUM CHLORIDE 0.9 % IJ SOLN
INTRAMUSCULAR | Status: AC
  Filled 2013-12-05: qty 10

## 2013-12-05 MED ORDER — LACTATED RINGERS IV SOLN
INTRAVENOUS | Status: DC | PRN
  Administered 2013-12-05: 07:00:00 via INTRAVENOUS

## 2013-12-05 MED ORDER — DEXAMETHASONE SODIUM PHOSPHATE 10 MG/ML IJ SOLN
INTRAMUSCULAR | Status: DC | PRN
  Administered 2013-12-05: 10 mg via INTRAVENOUS

## 2013-12-05 MED ORDER — FENTANYL CITRATE 0.05 MG/ML IJ SOLN
INTRAMUSCULAR | Status: DC | PRN
  Administered 2013-12-05: 100 ug via INTRAVENOUS

## 2013-12-05 MED ORDER — EPHEDRINE SULFATE 50 MG/ML IJ SOLN
INTRAMUSCULAR | Status: AC
  Filled 2013-12-05: qty 1

## 2013-12-05 MED ORDER — ONDANSETRON HCL 4 MG/2ML IJ SOLN
INTRAMUSCULAR | Status: AC
  Filled 2013-12-05: qty 2

## 2013-12-05 MED ORDER — PROPOFOL 10 MG/ML IV BOLUS
INTRAVENOUS | Status: AC
  Filled 2013-12-05: qty 20

## 2013-12-05 MED ORDER — SODIUM CHLORIDE 0.9 % IV SOLN
INTRAVENOUS | Status: DC | PRN
  Administered 2013-12-05: 09:00:00

## 2013-12-05 NOTE — Progress Notes (Signed)
[facility] PROGRESS NOTE  Madeline Rodriguez:[id number] DOB: 1934/08/08 DOA: 12/04/2013 PCP: Birdena Crandall, MD  Assessment/Plan: 1. Cholelithiasis with choledocholithiasis/CBD obstruction  -s/p ERCPwith sphincterotomy/papillotomy and removal of calculus/calculi -lipase normal  -will need cholecystectomy, CCS consult  -Appreciate GI consult  -IVF,clears  2. HTN  -continue norvasc   3. RBBB  -chronic, stable   DVT proph: lovenox   Code Status: Full Code Family Communication: d/w spouse at bedside Disposition Plan: home    Consultants:  Gi   CCS  Procedures: 4/25: ERCP  with sphincterotomy/papillotomy and   removal of calculus/calculi    HPI/Subjective: Feels well, just back from ERCP  Objective: Filed Vitals:   12/05/13 0927  BP: 129/63  Pulse: 72  Temp: 98 F (36.7 C)  Resp: 18    Intake/Output Summary (Last 24 hours) at 12/05/13 0957 Last data filed at 12/05/13 6568  Gross per 24 hour  Intake 862.08 ml  Output   1000 ml  Net -137.92 ml   Filed Weights   12/04/13 1137  Weight: 86.093 kg (189 lb 12.8 oz)    Exam:   General: AAOx3  Cardiovascular: S1S2/RRR  Respiratory: CTAB  Abdomen: Soft, Nt, ND, BS present  Musculoskeletal: no edema c/c   Data Reviewed: Basic Metabolic Panel:  Recent Labs Lab 12/04/13 0838 12/05/13 0547  NA 136* 142  K 3.5* 4.0  CL 97 105  CO2 22 24  GLUCOSE 244* 83  BUN 21 19  CREATININE 0.80 0.88  CALCIUM 10.1 9.3   Liver Function Tests:  Recent Labs Lab 12/04/13 0838 12/05/13 0547  AST 966* 262*  ALT 642* 357*  ALKPHOS 168* 130*  BILITOT 2.7* 2.6*  PROT 7.3 6.1  ALBUMIN 4.1 3.3*    Recent Labs Lab 12/04/13 0838  LIPASE 19   No results found for this basename: AMMONIA,  in the last 168 hours CBC:  Recent Labs Lab 12/04/13 0838 12/05/13 0547  WBC 11.3* 13.0*  NEUTROABS 10.8*  --   HGB 13.9 13.1  HCT 42.4 39.8  MCV 85.7 87.3  PLT 217 141*   Cardiac  Enzymes: No results found for this basename: CKTOTAL, CKMB, CKMBINDEX, TROPONINI,  in the last 168 hours BNP (last 3 results) No results found for this basename: PROBNP,  in the last 8760 hours CBG:  Recent Labs Lab 12/04/13 2125  GLUCAP 105*    No results found for this or any previous visit (from the past 240 hour(s)).   Studies: US Abdomen Complete  12/04/2013   CLINICAL DATA:  Abdominal pain  EXAM: ULTRASOUND ABDOMEN COMPLETE  COMPARISON:  06/19/2010  FINDINGS: Gallbladder:  Multiple gallstones are identified. No gallbladder wall thickening is noted. A negative sonographic Percell Miller sign is noted.  Common bile duct:  Diameter: 13 mm  Liver:  No focal lesion identified. Within normal limits in parenchymal echogenicity.  IVC:  No abnormality visualized.  Pancreas:  Visualized portion unremarkable.  Spleen:  Size and appearance within normal limits.  Right Kidney:  Length: 10.6 cm. A 1.2 cm parapelvic cyst is seen. Echogenicity within normal limits. No mass or hydronephrosis visualized.  Left Kidney:  Length: 11.6 cm.  Mild hydronephrosis is noted.  Abdominal aorta:  No aneurysm visualized.  Other findings:  Ureteral jet is noted on the left.  IMPRESSION: Cholelithiasis.  Dilated common bile duct which may be related to a distal common bile duct stone. Further evaluation is recommended.  Left-sided hydronephrosis which persists following voiding. A left ureteral jet is noted. This is  of uncertain etiology.   Electronically Signed   By: Inez Catalina M.D.   On: 12/04/2013 09:39   Dg Ercp Biliary & Pancreatic Ducts  12/05/2013   CLINICAL DATA:  Gallstones  EXAM: ERCP  TECHNIQUE: Multiple spot images obtained with the fluoroscopic device and submitted for interpretation post-procedure.  FLUOROSCOPY TIME:  5 min, 5 seconds  COMPARISON:  US ABDOMEN COMPLETE dated 12/04/2013  FINDINGS: Three spot intraoperative fluoroscopic images during ERCP are provided for review.  Initial image demonstrates an ERCP  probe overlying the right upper abdominal quadrant with selective cannulation opacification of the common bile duct. Evaluation of the common bile duct is degraded secondary to suboptimal opacification though there may be a filling defect in the distal aspect of the common bile duct.  Subsequent images demonstrate insufflation of a biliary sphincterotomy balloon within the mid and distal aspects of the bile duct.  IMPRESSION: ERCP with potential findings of choledocholithiasis as above.  These images were submitted for radiologic interpretation only. Please see the procedural report for the amount of contrast and the fluoroscopy time utilized.   Electronically Signed   By: Sandi Mariscal M.D.   On: 12/05/2013 09:21    Scheduled Meds: . amLODipine  2.5 mg Oral Daily  . cefTRIAXone (ROCEPHIN)  IV  1 g Intravenous Q24H  . enoxaparin (LOVENOX) injection  40 mg Subcutaneous Q24H  . multivitamin with minerals  1 tablet Oral Daily  . pantoprazole  40 mg Oral Daily   Continuous Infusions: . sodium chloride 50 mL/hr at 12/04/13 1811   Antibiotics Given (last 72 hours)   Date/Time Action Medication Dose Rate   12/04/13 1524 Given   cefTRIAXone (ROCEPHIN) 1 g in dextrose 5 % 50 mL IVPB 1 g 100 mL/hr   12/05/13 0745 Given   cefTRIAXone (ROCEPHIN) 1 g in dextrose 5 % 50 mL IVPB 1 g       Active Problems:   Choledocholithiasis with obstruction   Choledocholithiasis   HTN (hypertension)    Time spent: 27min    [facility] Pager [phone]. If 7PM-7AM, please contact night-coverage at [email or web address], password [facility] 12/05/2013, 9:57 AM  LOS: 1 day

## 2013-12-05 NOTE — Anesthesia Preprocedure Evaluation (Signed)
Anesthesia Evaluation  Patient identified by MRN, date of birth, ID band Patient awake    Reviewed: Allergy & Precautions, H&P , NPO status , Patient's Chart, lab work & pertinent test results  Airway Mallampati: II TM Distance: >3 FB Neck ROM: Full    Dental no notable dental hx. (+) Teeth Intact   Pulmonary neg pulmonary ROS,  breath sounds clear to auscultation  Pulmonary exam normal       Cardiovascular hypertension, Pt. on medications + dysrhythmias Rhythm:Regular Rate:Normal  RBBB on EKG   Neuro/Psych negative neurological ROS  negative psych ROS   GI/Hepatic GERD-  Medicated and Controlled,Elevated LFT's Choledocholithiasis Cholelithiasis   Endo/Other  negative endocrine ROSObesity  Renal/GU negative Renal ROS  negative genitourinary   Musculoskeletal negative musculoskeletal ROS (+)   Abdominal (+) - obese,   Peds  Hematology negative hematology ROS (+)   Anesthesia Other Findings   Reproductive/Obstetrics negative OB ROS                           Anesthesia Physical Anesthesia Plan  ASA: II  Anesthesia Plan: General   Post-op Pain Management:    Induction: Intravenous  Airway Management Planned: Oral ETT  Additional Equipment:   Intra-op Plan:   Post-operative Plan: Extubation in OR  Informed Consent: I have reviewed the patients History and Physical, chart, labs and discussed the procedure including the risks, benefits and alternatives for the proposed anesthesia with the patient or authorized representative who has indicated his/her understanding and acceptance.   Dental advisory given  Plan Discussed with: CRNA, Anesthesiologist and Surgeon  Anesthesia Plan Comments:         Anesthesia Quick Evaluation

## 2013-12-05 NOTE — Progress Notes (Signed)
Madeline Rodriguez 7:32 AM  Subjective: Patient doing much better without new complaint and we rediscussed ERCP and then subsequent cholecystectomy  Objective: Vital signs stable afebrile no acute distress exam please see pre-assessment evaluation labs okay LFTs still elevated  Assessment: Gallstones and probable CBD stone  Plan: The risks benefits methods of ERCP was discussed with the patient and her family and will proceed this morning with anesthesia assistance with further workup and plans pending those findings but she will need a surgical consult at some point  Madeline Rodriguez

## 2013-12-05 NOTE — Op Note (Signed)
[facility] [place], [id number]   ERCP PROCEDURE REPORT  PATIENT: Madeline Rodriguez, Madeline Rodriguez.  MR# :[id number] BIRTHDATE: September 10, 1933  GENDER: Female ENDOSCOPIST: Clarene Essex, MD REFERRED BY: PROCEDURE DATE:  12/05/2013 PROCEDURE:   ERCP with sphincterotomy/papillotomy and ERCP with removal of calculus/calculi ASA CLASS:    2 INDICATIONS: probable CBD stones MEDICATIONS:    general anesthesia TOPICAL ANESTHETIC:  none  DESCRIPTION OF PROCEDURE:   After the risks benefits and alternatives of the procedure were thoroughly explained, informed consent was obtained.  The ercp pentax [id number]  endoscope was introduced through the mouth and advanced to the second portion of the duodenum .  no obvious endoscopic findings were seen as we advanced to the second portion of the duodenum where a moderate periampullary diverticulum was seen as well as a bulbous intramural segment and using the triple-lumen sphincterotome loaded with the JAG Jagwire we were able to get deep selective cannulation after a few attempts which included the wire probably going towards the pancreas a few times and probable minimal dye injected into the pancreas versus cystic duct and obvious CBD stones were seen on initial cholangiogram and we proceeded with a moderately large sphincterotomy in the customary fashion in fact a small stone fell out with that maneuver and then we exchanged the sphincter tone after we had adequate biliary drainage and couldt get the fully bowed sphincterotome easily in and out of the duct for the adjustable 12-15 mm balloon which was advanced to the bifurcation and inflated to 15 and multiple balloon pull-through this were done and multiple stones debris and sludge were removed and the balloon passed readily through the patent sphincterotomy site and when no further stones were removed we went ahead and proceeded with an occlusion cholangiogram which was normal  and the balloon was pulled through one more time and there was adequate biliary drainage and no obvious residual stones and the scope was removed after air was suctioned and the balloon and wire removed and the patient tolerated the procedure well there was no obvious immediate complication         COMPLICATIONS:   none  ENDOSCOPIC IMPRESSION:1. Moderate periampullary diverticulum with bulbous ampulla status post sphincterotomy and multiple CBD stones removed 2. 1 minimal probable pancreatic duct injection and few wire advancements towards the pancreas 3 adequate biliary drainage and no obvious residual stones at the end of the procedure RECOMMENDATIONS:customary post-ERCP orders and if no delayed complications surgical consult and hopefully laparoscopic cholecystectomy tomorrow or early next weekand then would like to see back in 3 months if she does well to discuss repeat screening colonoscopy for her history of polyps     _______________________________ eSigned:  Clarene Essex, MD 12/05/2013 8:50 AM   CC:  PATIENT NAME:  Madeline Rodriguez, Madeline Rodriguez MR#: [id number]

## 2013-12-05 NOTE — Anesthesia Postprocedure Evaluation (Signed)
  Anesthesia Post-op Note  Patient: Madeline Rodriguez  Procedure(s) Performed: Procedure(s): ENDOSCOPIC RETROGRADE CHOLANGIOPANCREATOGRAPHY (ERCP) (N/A)  Patient Location: PACU  Anesthesia Type:General  Level of Consciousness: awake, alert  and oriented  Airway and Oxygen Therapy: Patient Spontanous Breathing  Post-op Pain: none  Post-op Assessment: Post-op Vital signs reviewed, Patient's Cardiovascular Status Stable, Respiratory Function Stable, Patent Airway, No signs of Nausea or vomiting and Pain level controlled  Post-op Vital Signs: Reviewed and stable  Last Vitals:  Filed Vitals:   12/05/13 0845  BP: 116/55  Pulse: 73  Temp:   Resp: 12    Complications: No apparent anesthesia complications

## 2013-12-05 NOTE — Transfer of Care (Signed)
Immediate Anesthesia Transfer of Care Note  Patient: Madeline Rodriguez  Procedure(s) Performed: Procedure(s): ENDOSCOPIC RETROGRADE CHOLANGIOPANCREATOGRAPHY (ERCP) (N/A)  Patient Location: PACU  Anesthesia Type:General  Level of Consciousness: awake  Airway & Oxygen Therapy: Patient Spontanous Breathing and Patient connected to face mask oxygen  Post-op Assessment: Report given to PACU RN and Post -op Vital signs reviewed and stable  Post vital signs: Reviewed and stable  Complications: No apparent anesthesia complications

## 2013-12-05 NOTE — Anesthesia Procedure Notes (Signed)
Procedure Name: Intubation Date/Time: 12/05/2013 7:43 AM Performed by: Danley Danker L Patient Re-evaluated:Patient Re-evaluated prior to inductionOxygen Delivery Method: Circle system utilized Preoxygenation: Pre-oxygenation with 100% oxygen Intubation Type: IV induction Ventilation: Mask ventilation without difficulty Laryngoscope Size: Miller and 2 Grade View: Grade I Tube type: Oral Tube size: 7.5 mm Number of attempts: 2 Airway Equipment and Method: Stylet Placement Confirmation: ETT inserted through vocal cords under direct vision,  breath sounds checked- equal and bilateral and positive ETCO2 Secured at: 20 cm Tube secured with: Tape Dental Injury: Teeth and Oropharynx as per pre-operative assessment

## 2013-12-05 NOTE — Consult Note (Signed)
Reason for Consult:cholelithiasis Referring Physician: [name] is an 80 y.o. female.  HPI: I was asked to evaluate this patient by Dr. Watt Climes.  The patient has a several year history of known cholelithiasis. She had an episode of abdominal pain about 5 years ago and was found to have gallstones. At that time she was going through a traumatic episode with the death of her daughter and did not pursue treatment. She has continued to have some episodic epigastric pain related to meals intermittently over the last several years. These were generally not particularly severe or frequent. However yesterday morning she developed the onset of severe epigastric pain associated with nausea and vomiting and presented to the emergency room.  The pain resolved fairly quickly after medication in the emergency department. Repeat ultrasound was obtained which showed gallstones as well as a dilated common bile duct at 11 mm. She was found to have moderately elevated LFTs. The patient was admitted in earlier today underwent ERCP with several stones extracted. She tolerated this procedure well. She has had some sharp left upper quadrant pain following the procedure earlier today but since being up out of bed and after one dose of pain medication is feeling somewhat better. We were asked to see the patient for consideration for cholecystectomy.  Past Medical History  Diagnosis Date  . Hypertension   . Acid reflux   . Cholecystitis   . Bundle branch block, right     History reviewed. No pertinent past surgical history.  Family History  Problem Relation Age of Onset  . Myasthenia gravis Mother   . Other Father     old age    Social History:  reports that she has never smoked. She has never used smokeless tobacco. She reports that she does not drink alcohol or use illicit drugs.  Allergies:  Allergies  Allergen Reactions  . Tape Other (See Comments)    Only use paper     Current  Facility-Administered Medications  Medication Dose Route Frequency Provider Last Rate Last Dose  . 0.9 %  sodium chloride infusion   Intravenous Continuous Domenic Polite, MD 50 mL/hr at 12/05/13 1000    . acetaminophen (TYLENOL) tablet 500 mg  500 mg Oral QHS PRN Domenic Polite, MD      . acetaminophen (TYLENOL) tablet 650 mg  650 mg Oral Q6H PRN Domenic Polite, MD   650 mg at 12/05/13 1238  . amLODipine (NORVASC) tablet 2.5 mg  2.5 mg Oral Daily Domenic Polite, MD   2.5 mg at 12/05/13 0946  . cefTRIAXone (ROCEPHIN) 1 g in dextrose 5 % 50 mL IVPB  1 g Intravenous Q24H Winfield Cunas., MD   1 g at 12/05/13 1248  . enoxaparin (LOVENOX) injection 40 mg  40 mg Subcutaneous Q24H Domenic Polite, MD   40 mg at 12/05/13 1717  . morphine 2 MG/ML injection 2 mg  2 mg Intravenous Q3H PRN Domenic Polite, MD   2 mg at 12/05/13 1432  . multivitamin with minerals tablet 1 tablet  1 tablet Oral Daily Domenic Polite, MD   1 tablet at 12/05/13 0946  . ondansetron (ZOFRAN) injection 4 mg  4 mg Intravenous Q6H PRN Domenic Polite, MD      . ondansetron [facility]) injection 4 mg  4 mg Intravenous Q6H PRN Domenic Polite, MD   4 mg at 12/05/13 1238  . pantoprazole (PROTONIX) EC tablet 40 mg  40 mg Oral Daily Domenic Polite, MD  40 mg at 12/05/13 8250     Results for orders placed during the hospital encounter of 12/04/13 (from the past 48 hour(s))  URINALYSIS, ROUTINE W REFLEX MICROSCOPIC     Status: Abnormal   Collection Time    12/04/13  8:36 AM      Result Value Ref Range   Color, Urine YELLOW  YELLOW   APPearance CLEAR  CLEAR   Specific Gravity, Urine 1.017  1.005 - 1.030   pH 6.5  5.0 - 8.0   Glucose, UA 500 (*) NEGATIVE mg/dL   Hgb urine dipstick NEGATIVE  NEGATIVE   Bilirubin Urine NEGATIVE  NEGATIVE   Ketones, ur 40 (*) NEGATIVE mg/dL   Protein, ur NEGATIVE  NEGATIVE mg/dL   Urobilinogen, UA 0.2  0.0 - 1.0 mg/dL   Nitrite NEGATIVE  NEGATIVE   Leukocytes, UA NEGATIVE  NEGATIVE   Comment:  MICROSCOPIC NOT DONE ON URINES WITH NEGATIVE PROTEIN, BLOOD, LEUKOCYTES, NITRITE, OR GLUCOSE <1000 mg/dL.  CBC WITH DIFFERENTIAL     Status: Abnormal   Collection Time    12/04/13  8:38 AM      Result Value Ref Range   WBC 11.3 (*) 4.0 - 10.5 K/uL   RBC 4.95  3.87 - 5.11 MIL/uL   Hemoglobin 13.9  12.0 - 15.0 g/dL   HCT 42.4  36.0 - 46.0 %   MCV 85.7  78.0 - 100.0 fL   MCH 28.1  26.0 - 34.0 pg   MCHC 32.8  30.0 - 36.0 g/dL   RDW 13.6  11.5 - 15.5 %   Platelets 217  150 - 400 K/uL   Neutrophils Relative % 95 (*) 43 - 77 %   Neutro Abs 10.8 (*) 1.7 - 7.7 K/uL   Lymphocytes Relative 1 (*) 12 - 46 %   Lymphs Abs 0.1 (*) 0.7 - 4.0 K/uL   Monocytes Relative 4  3 - 12 %   Monocytes Absolute 0.4  0.1 - 1.0 K/uL   Eosinophils Relative 0  0 - 5 %   Eosinophils Absolute 0.0  0.0 - 0.7 K/uL   Basophils Relative 0  0 - 1 %   Basophils Absolute 0.0  0.0 - 0.1 K/uL  COMPREHENSIVE METABOLIC PANEL     Status: Abnormal   Collection Time    12/04/13  8:38 AM      Result Value Ref Range   Sodium 136 (*) 137 - 147 mEq/L   Potassium 3.5 (*) 3.7 - 5.3 mEq/L   Chloride 97  96 - 112 mEq/L   CO2 22  19 - 32 mEq/L   Glucose, Bld 244 (*) 70 - 99 mg/dL   BUN 21  6 - 23 mg/dL   Creatinine, Ser 0.80  0.50 - 1.10 mg/dL   Calcium 10.1  8.4 - 10.5 mg/dL   Total Protein 7.3  6.0 - 8.3 g/dL   Albumin 4.1  3.5 - 5.2 g/dL   AST 966 (*) 0 - 37 U/L   ALT 642 (*) 0 - 35 U/L   Alkaline Phosphatase 168 (*) 39 - 117 U/L   Total Bilirubin 2.7 (*) 0.3 - 1.2 mg/dL   GFR calc non Af Amer 69 (*) >90 mL/min   GFR calc Af Amer 80 (*) >90 mL/min   Comment: (NOTE)     The eGFR has been calculated using the CKD EPI equation.     This calculation has not been validated in all clinical situations.     eGFR's persistently <  90 mL/min signify possible Chronic Kidney     Disease.  LIPASE, BLOOD     Status: None   Collection Time    12/04/13  8:38 AM      Result Value Ref Range   Lipase 19  11 - 59 U/L  I-STAT TROPOININ, ED      Status: None   Collection Time    12/04/13  8:48 AM      Result Value Ref Range   Troponin i, poc 0.01  0.00 - 0.08 ng/mL   Comment 3            Comment: Due to the release kinetics of cTnI,     a negative result within the first hours     of the onset of symptoms does not rule out     myocardial infarction with certainty.     If myocardial infarction is still suspected,     repeat the test at appropriate intervals.  GLUCOSE, CAPILLARY     Status: Abnormal   Collection Time    12/04/13  9:25 PM      Result Value Ref Range   Glucose-Capillary 105 (*) 70 - 99 mg/dL  COMPREHENSIVE METABOLIC PANEL     Status: Abnormal   Collection Time    12/05/13  5:47 AM      Result Value Ref Range   Sodium 142  137 - 147 mEq/L   Potassium 4.0  3.7 - 5.3 mEq/L   Chloride 105  96 - 112 mEq/L   Comment: DELTA CHECK NOTED   CO2 24  19 - 32 mEq/L   Glucose, Bld 83  70 - 99 mg/dL   BUN 19  6 - 23 mg/dL   Creatinine, Ser 0.88  0.50 - 1.10 mg/dL   Calcium 9.3  8.4 - 10.5 mg/dL   Total Protein 6.1  6.0 - 8.3 g/dL   Albumin 3.3 (*) 3.5 - 5.2 g/dL   AST 262 (*) 0 - 37 U/L   ALT 357 (*) 0 - 35 U/L   Alkaline Phosphatase 130 (*) 39 - 117 U/L   Total Bilirubin 2.6 (*) 0.3 - 1.2 mg/dL   GFR calc non Af Amer 62 (*) >90 mL/min   GFR calc Af Amer 72 (*) >90 mL/min   Comment: (NOTE)     The eGFR has been calculated using the CKD EPI equation.     This calculation has not been validated in all clinical situations.     eGFR's persistently <90 mL/min signify possible Chronic Kidney     Disease.  CBC     Status: Abnormal   Collection Time    12/05/13  5:47 AM      Result Value Ref Range   WBC 13.0 (*) 4.0 - 10.5 K/uL   RBC 4.56  3.87 - 5.11 MIL/uL   Hemoglobin 13.1  12.0 - 15.0 g/dL   HCT 39.8  36.0 - 46.0 %   MCV 87.3  78.0 - 100.0 fL   MCH 28.7  26.0 - 34.0 pg   MCHC 32.9  30.0 - 36.0 g/dL   RDW 14.6  11.5 - 15.5 %   Platelets 141 (*) 150 - 400 K/uL   Comment: SPECIMEN CHECKED FOR CLOTS      DELTA CHECK NOTED     RESULT REPEATED AND VERIFIED    US Abdomen Complete  12/04/2013   CLINICAL DATA:  Abdominal pain  EXAM: ULTRASOUND ABDOMEN COMPLETE  COMPARISON:  06/19/2010  FINDINGS: Gallbladder:  Multiple gallstones are identified. No gallbladder wall thickening is noted. A negative sonographic Percell Miller sign is noted.  Common bile duct:  Diameter: 13 mm  Liver:  No focal lesion identified. Within normal limits in parenchymal echogenicity.  IVC:  No abnormality visualized.  Pancreas:  Visualized portion unremarkable.  Spleen:  Size and appearance within normal limits.  Right Kidney:  Length: 10.6 cm. A 1.2 cm parapelvic cyst is seen. Echogenicity within normal limits. No mass or hydronephrosis visualized.  Left Kidney:  Length: 11.6 cm.  Mild hydronephrosis is noted.  Abdominal aorta:  No aneurysm visualized.  Other findings:  Ureteral jet is noted on the left.  IMPRESSION: Cholelithiasis.  Dilated common bile duct which may be related to a distal common bile duct stone. Further evaluation is recommended.  Left-sided hydronephrosis which persists following voiding. A left ureteral jet is noted. This is of uncertain etiology.   Electronically Signed   By: Inez Catalina M.D.   On: 12/04/2013 09:39   Dg Ercp Biliary & Pancreatic Ducts  12/05/2013   CLINICAL DATA:  Gallstones  EXAM: ERCP  TECHNIQUE: Multiple spot images obtained with the fluoroscopic device and submitted for interpretation post-procedure.  FLUOROSCOPY TIME:  5 min, 5 seconds  COMPARISON:  US ABDOMEN COMPLETE dated 12/04/2013  FINDINGS: Three spot intraoperative fluoroscopic images during ERCP are provided for review.  Initial image demonstrates an ERCP probe overlying the right upper abdominal quadrant with selective cannulation opacification of the common bile duct. Evaluation of the common bile duct is degraded secondary to suboptimal opacification though there may be a filling defect in the distal aspect of the common bile duct.   Subsequent images demonstrate insufflation of a biliary sphincterotomy balloon within the mid and distal aspects of the bile duct.  IMPRESSION: ERCP with potential findings of choledocholithiasis as above.  These images were submitted for radiologic interpretation only. Please see the procedural report for the amount of contrast and the fluoroscopy time utilized.   Electronically Signed   By: Sandi Mariscal M.D.   On: 12/05/2013 09:21    Review of Systems  Constitutional: Negative for fever and chills.  Respiratory: Negative.   Cardiovascular: Negative.   Gastrointestinal: Positive for nausea, vomiting and abdominal pain. Negative for diarrhea, constipation and blood in stool.  Genitourinary: Negative.    Blood pressure 128/58, pulse 92, temperature 98.4 F (36.9 C), temperature source Oral, resp. rate 18, height '5\' 6"'  (1.676 m), weight 189 lb 12.8 oz (86.093 kg), SpO2 96.00%. Physical Exam General: Alert, well-developed Caucasian female, in no distress Skin: Warm and dry without rash or infection. HEENT: No palpable masses or thyromegaly. Sclera nonicteric. Pupils equal round and reactive. Oropharynx clear. Lungs: Breath sounds clear and equal without increased work of breathing Cardiovascular: Regular rate and rhythm without murmur. No JVD or edema. Peripheral pulses intact. Abdomen: Nondistended. Soft with minimal epigastric and left upper quadrant tenderness, no guarding. No masses palpable. No organomegaly. No palpable hernias. Extremities: No edema or joint swelling or deformity. No chronic venous stasis changes. Neurologic: Alert and fully oriented. Affect normal  Assessment/Plan: Long-standing symptomatic cholelithiasis. She presented with common bile duct stones and is status post successful ERCP and stone extraction. I recommended proceeding with laparoscopic cholecystectomy to prevent further episodes of biliary colic and complications of cholecystitis. Alternatives were discussed.I  discussed the procedure in detail.    We discussed the risks and benefits of a laparoscopic cholecystectomy and possible cholangiogram including, but not limited to bleeding, infection, injury to surrounding structures  such as the intestine or liver, bile leak, retained gallstones, need to convert to an open procedure, prolonged diarrhea, blood clots such as  DVT, common bile duct injury, anesthesia risks, and possible need for additional procedures.  The likelihood of improvement in symptoms and return to the patient's normal status is good. We discussed the typical post-operative recovery course. All of her and her family's questions were answered. I would like to reexamine her in check lab work in the morning as she has had some pain post ERCP to make sure that she is not developing pancreatitis. If she is doing well in the morning we plan to proceed with laparoscopic cholecystectomy.  Madeline Rodriguez 12/05/2013, 5:31 PM

## 2013-12-06 ENCOUNTER — Inpatient Hospital Stay (HOSPITAL_COMMUNITY): Payer: Medicare Other | Admitting: Anesthesiology

## 2013-12-06 ENCOUNTER — Encounter (HOSPITAL_COMMUNITY)
Admission: EM | Disposition: A | Discharge status: Home / Self Care | Payer: Self-pay | Source: Home / Self Care | Attending: Internal Medicine

## 2013-12-06 ENCOUNTER — Inpatient Hospital Stay (HOSPITAL_COMMUNITY): Payer: Medicare Other

## 2013-12-06 ENCOUNTER — Encounter (HOSPITAL_COMMUNITY): Payer: Medicare Other | Admitting: Anesthesiology

## 2013-12-06 ENCOUNTER — Encounter (HOSPITAL_COMMUNITY): Payer: Self-pay | Admitting: Certified Registered Nurse Anesthetist

## 2013-12-06 DIAGNOSIS — K8010 Calculus of gallbladder with chronic cholecystitis without obstruction: Secondary | ICD-10-CM

## 2013-12-06 HISTORY — PX: CHOLECYSTECTOMY: SHX55

## 2013-12-06 LAB — CBC WITH DIFFERENTIAL/PLATELET
Basophils Absolute: 0 10*3/uL (ref 0.0–0.1)
Basophils Relative: 0 % (ref 0–1)
Eosinophils Absolute: 0 10*3/uL (ref 0.0–0.7)
Eosinophils Relative: 0 % (ref 0–5)
HCT: 40.9 % (ref 36.0–46.0)
Hemoglobin: 13.1 g/dL (ref 12.0–15.0)
LYMPHS ABS: 0.7 10*3/uL (ref 0.7–4.0)
LYMPHS PCT: 5 % — AB (ref 12–46)
MCH: 28.5 pg (ref 26.0–34.0)
MCHC: 32 g/dL (ref 30.0–36.0)
MCV: 89.1 fL (ref 78.0–100.0)
Monocytes Absolute: 0.9 10*3/uL (ref 0.1–1.0)
Monocytes Relative: 6 % (ref 3–12)
NEUTROS ABS: 13.3 10*3/uL — AB (ref 1.7–7.7)
NEUTROS PCT: 89 % — AB (ref 43–77)
PLATELETS: 197 10*3/uL (ref 150–400)
RBC: 4.59 MIL/uL (ref 3.87–5.11)
RDW: 14.9 % (ref 11.5–15.5)
WBC: 14.9 10*3/uL — ABNORMAL HIGH (ref 4.0–10.5)

## 2013-12-06 LAB — COMPREHENSIVE METABOLIC PANEL
ALK PHOS: 138 U/L — AB (ref 39–117)
ALT: 239 U/L — AB (ref 0–35)
AST: 92 U/L — ABNORMAL HIGH (ref 0–37)
Albumin: 3.2 g/dL — ABNORMAL LOW (ref 3.5–5.2)
BUN: 18 mg/dL (ref 6–23)
CHLORIDE: 104 meq/L (ref 96–112)
CO2: 25 meq/L (ref 19–32)
Calcium: 9.6 mg/dL (ref 8.4–10.5)
Creatinine, Ser: 0.81 mg/dL (ref 0.50–1.10)
GFR calc non Af Amer: 68 mL/min — ABNORMAL LOW (ref >90)
GFR, EST AFRICAN AMERICAN: 79 mL/min — AB (ref >90)
GLUCOSE: 119 mg/dL — AB (ref 70–99)
POTASSIUM: 4.2 meq/L (ref 3.7–5.3)
SODIUM: 143 meq/L (ref 137–147)
Total Bilirubin: 0.9 mg/dL (ref 0.3–1.2)
Total Protein: 6.8 g/dL (ref 6.0–8.3)

## 2013-12-06 LAB — GLUCOSE, CAPILLARY: Glucose-Capillary: 135 mg/dL — ABNORMAL HIGH (ref 70–99)

## 2013-12-06 LAB — LIPASE, BLOOD: Lipase: 19 U/L (ref 11–59)

## 2013-12-06 SURGERY — LAPAROSCOPIC CHOLECYSTECTOMY WITH INTRAOPERATIVE CHOLANGIOGRAM
Anesthesia: General | Site: Abdomen

## 2013-12-06 MED ORDER — FENTANYL CITRATE 0.05 MG/ML IJ SOLN
INTRAMUSCULAR | Status: AC
  Filled 2013-12-06: qty 5

## 2013-12-06 MED ORDER — ONDANSETRON HCL 4 MG/2ML IJ SOLN
INTRAMUSCULAR | Status: AC
  Filled 2013-12-06: qty 2

## 2013-12-06 MED ORDER — HYDROMORPHONE HCL PF 1 MG/ML IJ SOLN
INTRAMUSCULAR | Status: AC
  Filled 2013-12-06: qty 1

## 2013-12-06 MED ORDER — BUPIVACAINE-EPINEPHRINE 0.25% -1:200000 IJ SOLN
INTRAMUSCULAR | Status: DC | PRN
  Administered 2013-12-06: 20 mL

## 2013-12-06 MED ORDER — PROPOFOL 10 MG/ML IV BOLUS
INTRAVENOUS | Status: DC | PRN
  Administered 2013-12-06: 140 mg via INTRAVENOUS

## 2013-12-06 MED ORDER — LACTATED RINGERS IV SOLN
INTRAVENOUS | Status: DC | PRN
  Administered 2013-12-06: 10:00:00 via INTRAVENOUS

## 2013-12-06 MED ORDER — OXYCODONE-ACETAMINOPHEN 5-325 MG PO TABS
1.0000 | ORAL_TABLET | ORAL | Status: DC | PRN
  Administered 2013-12-06 – 2013-12-07 (×2): 1 via ORAL
  Filled 2013-12-06 (×2): qty 1

## 2013-12-06 MED ORDER — ROCURONIUM BROMIDE 100 MG/10ML IV SOLN
INTRAVENOUS | Status: DC | PRN
  Administered 2013-12-06: 15 mg via INTRAVENOUS
  Administered 2013-12-06 (×3): 5 mg via INTRAVENOUS

## 2013-12-06 MED ORDER — METOCLOPRAMIDE HCL 5 MG/ML IJ SOLN
10.0000 mg | Freq: Once | INTRAMUSCULAR | Status: AC | PRN
  Filled 2013-12-06: qty 2

## 2013-12-06 MED ORDER — HYDROMORPHONE HCL PF 1 MG/ML IJ SOLN
0.2500 mg | INTRAMUSCULAR | Status: DC | PRN
  Administered 2013-12-06 (×4): 0.5 mg via INTRAVENOUS

## 2013-12-06 MED ORDER — FENTANYL CITRATE 0.05 MG/ML IJ SOLN
INTRAMUSCULAR | Status: DC | PRN
  Administered 2013-12-06: 100 ug via INTRAVENOUS
  Administered 2013-12-06 (×2): 25 ug via INTRAVENOUS

## 2013-12-06 MED ORDER — SUCCINYLCHOLINE CHLORIDE 20 MG/ML IJ SOLN
INTRAMUSCULAR | Status: DC | PRN
  Administered 2013-12-06: 100 mg via INTRAVENOUS

## 2013-12-06 MED ORDER — PROPOFOL 10 MG/ML IV BOLUS
INTRAVENOUS | Status: AC
  Filled 2013-12-06: qty 20

## 2013-12-06 MED ORDER — GLYCOPYRROLATE 0.2 MG/ML IJ SOLN
INTRAMUSCULAR | Status: AC
  Filled 2013-12-06: qty 1

## 2013-12-06 MED ORDER — MIDAZOLAM HCL 2 MG/2ML IJ SOLN
INTRAMUSCULAR | Status: AC
  Filled 2013-12-06: qty 2

## 2013-12-06 MED ORDER — NEOSTIGMINE METHYLSULFATE 1 MG/ML IJ SOLN
INTRAMUSCULAR | Status: DC | PRN
  Administered 2013-12-06: 5 mg via INTRAVENOUS

## 2013-12-06 MED ORDER — MEPERIDINE HCL 50 MG/ML IJ SOLN
6.2500 mg | INTRAMUSCULAR | Status: DC | PRN
Start: 2013-12-06 — End: 2013-12-06

## 2013-12-06 MED ORDER — LIDOCAINE HCL (CARDIAC) 20 MG/ML IV SOLN
INTRAVENOUS | Status: DC | PRN
  Administered 2013-12-06: 50 mg via INTRAVENOUS

## 2013-12-06 MED ORDER — LACTATED RINGERS IR SOLN
Status: DC | PRN
  Administered 2013-12-06: 1

## 2013-12-06 MED ORDER — ROCURONIUM BROMIDE 100 MG/10ML IV SOLN
INTRAVENOUS | Status: AC
  Filled 2013-12-06: qty 1

## 2013-12-06 MED ORDER — ONDANSETRON HCL 4 MG/2ML IJ SOLN
INTRAMUSCULAR | Status: DC | PRN
  Administered 2013-12-06: 4 mg via INTRAVENOUS

## 2013-12-06 MED ORDER — IOHEXOL 300 MG/ML  SOLN
INTRAMUSCULAR | Status: DC | PRN
  Administered 2013-12-06: 8 mL via INTRAVENOUS

## 2013-12-06 MED ORDER — 0.9 % SODIUM CHLORIDE (POUR BTL) OPTIME
TOPICAL | Status: DC | PRN
  Administered 2013-12-06: 1000 mL

## 2013-12-06 MED ORDER — GLYCOPYRROLATE 0.2 MG/ML IJ SOLN
INTRAMUSCULAR | Status: DC | PRN
  Administered 2013-12-06: .4 mg via INTRAVENOUS

## 2013-12-06 MED ORDER — LIDOCAINE HCL (CARDIAC) 20 MG/ML IV SOLN
INTRAVENOUS | Status: AC
  Filled 2013-12-06: qty 5

## 2013-12-06 MED ORDER — MIDAZOLAM HCL 5 MG/5ML IJ SOLN
INTRAMUSCULAR | Status: DC | PRN
  Administered 2013-12-06: 1 mg via INTRAVENOUS

## 2013-12-06 MED ORDER — BUPIVACAINE-EPINEPHRINE (PF) 0.25% -1:200000 IJ SOLN
INTRAMUSCULAR | Status: AC
  Filled 2013-12-06: qty 30

## 2013-12-06 SURGICAL SUPPLY — 39 items
ADH SKN CLS APL DERMABOND .7 (GAUZE/BANDAGES/DRESSINGS) ×1
APPLIER CLIP ROT 10 11.4 M/L (STAPLE) ×3
APR CLP MED LRG 11.4X10 (STAPLE) ×1
BAG SPEC RTRVL LRG 6X4 10 (ENDOMECHANICALS) ×1
CANISTER SUCTION 2500CC (MISCELLANEOUS) ×3 IMPLANT
CATH REDDICK CHOLANGI 4FR 50CM (CATHETERS) IMPLANT
CHLORAPREP W/TINT 26ML (MISCELLANEOUS) ×3 IMPLANT
CLIP APPLIE ROT 10 11.4 M/L (STAPLE) ×1 IMPLANT
COVER MAYO STAND STRL (DRAPES) ×3 IMPLANT
DECANTER SPIKE VIAL GLASS SM (MISCELLANEOUS) ×1 IMPLANT
DERMABOND ADVANCED (GAUZE/BANDAGES/DRESSINGS) ×2
DERMABOND ADVANCED .7 DNX12 (GAUZE/BANDAGES/DRESSINGS) ×1 IMPLANT
DRAPE C-ARM 42X120 X-RAY (DRAPES) ×3 IMPLANT
DRAPE LAPAROSCOPIC ABDOMINAL (DRAPES) ×3 IMPLANT
DRAPE UTILITY XL STRL (DRAPES) ×3 IMPLANT
ELECT REM PT RETURN 9FT ADLT (ELECTROSURGICAL) ×3
ELECTRODE REM PT RTRN 9FT ADLT (ELECTROSURGICAL) ×1 IMPLANT
GLOVE BIOGEL PI IND STRL 7.5 (GLOVE) ×1 IMPLANT
GLOVE BIOGEL PI INDICATOR 7.5 (GLOVE) ×2
GLOVE SS BIOGEL STRL SZ 7.5 (GLOVE) ×1 IMPLANT
GLOVE SUPERSENSE BIOGEL SZ 7.5 (GLOVE) ×2
GOWN STRL REUS W/TWL XL LVL3 (GOWN DISPOSABLE) ×6 IMPLANT
HEMOSTAT SNOW SURGICEL 2X4 (HEMOSTASIS) ×2 IMPLANT
KIT BASIN OR (CUSTOM PROCEDURE TRAY) ×3 IMPLANT
NS IRRIG 1000ML POUR BTL (IV SOLUTION) ×2 IMPLANT
POUCH SPECIMEN RETRIEVAL 10MM (ENDOMECHANICALS) ×2 IMPLANT
SCISSORS LAP 5X35 DISP (ENDOMECHANICALS) ×3 IMPLANT
SET CHOLANGIOGRAPH MIX (MISCELLANEOUS) ×3 IMPLANT
SET IRRIG TUBING LAPAROSCOPIC (IRRIGATION / IRRIGATOR) ×3 IMPLANT
SLEEVE XCEL OPT CAN 5 100 (ENDOMECHANICALS) ×3 IMPLANT
SOLUTION ANTI FOG 6CC (MISCELLANEOUS) ×3 IMPLANT
SUT MNCRL AB 4-0 PS2 18 (SUTURE) ×3 IMPLANT
TOWEL OR 17X26 10 PK STRL BLUE (TOWEL DISPOSABLE) ×3 IMPLANT
TOWEL OR NON WOVEN STRL DISP B (DISPOSABLE) ×3 IMPLANT
TRAY LAP CHOLE (CUSTOM PROCEDURE TRAY) ×3 IMPLANT
TROCAR BLADELESS OPT 5 100 (ENDOMECHANICALS) ×3 IMPLANT
TROCAR XCEL BLUNT TIP 100MML (ENDOMECHANICALS) ×3 IMPLANT
TROCAR XCEL NON-BLD 11X100MML (ENDOMECHANICALS) ×3 IMPLANT
TUBING INSUFFLATION 10FT LAP (TUBING) ×3 IMPLANT

## 2013-12-06 NOTE — Anesthesia Postprocedure Evaluation (Signed)
  Anesthesia Post-op Note  Patient: Madeline Rodriguez  Procedure(s) Performed: Procedure(s): LAPAROSCOPIC CHOLECYSTECTOMY WITH INTRAOPERATIVE CHOLANGIOGRAM (N/A)  Patient Location: PACU  Anesthesia Type:General  Level of Consciousness: awake, alert  and oriented  Airway and Oxygen Therapy: Patient Spontanous Breathing  Post-op Pain: mild  Post-op Assessment: Post-op Vital signs reviewed, Patient's Cardiovascular Status Stable, Respiratory Function Stable, Patent Airway, No signs of Nausea or vomiting and Pain level controlled  Post-op Vital Signs: Reviewed and stable  Last Vitals:  Filed Vitals:   12/06/13 1235  BP: 112/69  Pulse: 56  Temp: 36.3 C  Resp: 14    Complications: No apparent anesthesia complications

## 2013-12-06 NOTE — Progress Notes (Signed)
pacu nursing:  Per dr. Excell Seltzer, no additional antibiotics needed post op.

## 2013-12-06 NOTE — Anesthesia Preprocedure Evaluation (Signed)
Anesthesia Evaluation  Patient identified by MRN, date of birth, ID band Patient awake    Reviewed: Allergy & Precautions, H&P , NPO status , Patient's Chart, lab work & pertinent test results  Airway Mallampati: II TM Distance: >3 FB Neck ROM: Full    Dental no notable dental hx. (+) Teeth Intact   Pulmonary neg pulmonary ROS,  breath sounds clear to auscultation  Pulmonary exam normal       Cardiovascular hypertension, Pt. on medications + dysrhythmias Rhythm:Regular Rate:Normal  RBBB on EKG   Neuro/Psych negative neurological ROS  negative psych ROS   GI/Hepatic GERD-  Medicated and Controlled,Elevated LFT's Cholecystitis Choledocholithiasis Cholelithiasis   Endo/Other  negative endocrine ROSObesity  Renal/GU negative Renal ROS  negative genitourinary   Musculoskeletal negative musculoskeletal ROS (+)   Abdominal (+) - obese,   Peds  Hematology negative hematology ROS (+)   Anesthesia Other Findings   Reproductive/Obstetrics negative OB ROS                           Anesthesia Physical  Anesthesia Plan  ASA: II and emergent  Anesthesia Plan: General   Post-op Pain Management:    Induction: Intravenous  Airway Management Planned: Oral ETT  Additional Equipment:   Intra-op Plan:   Post-operative Plan: Extubation in OR  Informed Consent: I have reviewed the patients History and Physical, chart, labs and discussed the procedure including the risks, benefits and alternatives for the proposed anesthesia with the patient or authorized representative who has indicated his/her understanding and acceptance.   Dental advisory given  Plan Discussed with: CRNA, Anesthesiologist and Surgeon  Anesthesia Plan Comments:         Anesthesia Quick Evaluation

## 2013-12-06 NOTE — Progress Notes (Signed)
[facility] PROGRESS NOTE  TAUNJA BRICKNER NWG:[id number] DOB: 1934/01/24 DOA: 12/04/2013 PCP: Birdena Crandall, MD  Assessment/Plan: 1. Cholelithiasis with choledocholithiasis/CBD obstruction  -s/p ERCPwith sphincterotomy/papillotomy and removal of calculus/calculi -lipase normal  -for Lap Chole today -Appreciate GI and CCS input   2. HTN  -continue norvasc   3. RBBB  -chronic, stable   DVT proph: lovenox   Code Status: Full Code Family Communication: d/w spouse at bedside Disposition Plan: home    Consultants:  Gi   CCS  Procedures: 4/25: ERCP  with sphincterotomy/papillotomy and   removal of calculus/calculi    HPI/Subjective: Feels well, noticed spots on neck  Objective: Filed Vitals:   12/06/13 0545  BP: 154/90  Pulse: 79  Temp: 98.2 F (36.8 C)  Resp: 18    Intake/Output Summary (Last 24 hours) at 12/06/13 1048 Last data filed at 12/05/13 1744  Gross per 24 hour  Intake    240 ml  Output    850 ml  Net   -610 ml   Filed Weights   12/04/13 1137  Weight: 86.093 kg (189 lb 12.8 oz)    Exam:   General: AAOx3  HEENT: couple of purpuric spots on neck  Cardiovascular: S1S2/RRR  Respiratory: CTAB  Abdomen: Soft, Nt, ND, BS present  Musculoskeletal: no edema c/c   Data Reviewed: Basic Metabolic Panel:  Recent Labs Lab 12/04/13 0838 12/05/13 0547 12/06/13 0550  NA 136* 142 143  K 3.5* 4.0 4.2  CL 97 105 104  CO2 22 24 25   GLUCOSE 244* 83 119*  BUN 21 19 18   CREATININE 0.80 0.88 0.81  CALCIUM 10.1 9.3 9.6   Liver Function Tests:  Recent Labs Lab 12/04/13 0838 12/05/13 0547 12/06/13 0550  AST 966* 262* 92*  ALT 642* 357* 239*  ALKPHOS 168* 130* 138*  BILITOT 2.7* 2.6* 0.9  PROT 7.3 6.1 6.8  ALBUMIN 4.1 3.3* 3.2*    Recent Labs Lab 12/04/13 0838 12/06/13 0550  LIPASE 19 19   No results found for this basename: AMMONIA,  in the last 168 hours CBC:  Recent Labs Lab 12/04/13 0838 12/05/13 0547  12/06/13 0550  WBC 11.3* 13.0* 14.9*  NEUTROABS 10.8*  --  13.3*  HGB 13.9 13.1 13.1  HCT 42.4 39.8 40.9  MCV 85.7 87.3 89.1  PLT 217 141* 197   Cardiac Enzymes: No results found for this basename: CKTOTAL, CKMB, CKMBINDEX, TROPONINI,  in the last 168 hours BNP (last 3 results) No results found for this basename: PROBNP,  in the last 8760 hours CBG:  Recent Labs Lab 12/04/13 2125  GLUCAP 105*    No results found for this or any previous visit (from the past 240 hour(s)).   Studies: Dg Ercp Biliary & Pancreatic Ducts  12/05/2013   CLINICAL DATA:  Gallstones  EXAM: ERCP  TECHNIQUE: Multiple spot images obtained with the fluoroscopic device and submitted for interpretation post-procedure.  FLUOROSCOPY TIME:  5 min, 5 seconds  COMPARISON:  US ABDOMEN COMPLETE dated 12/04/2013  FINDINGS: Three spot intraoperative fluoroscopic images during ERCP are provided for review.  Initial image demonstrates an ERCP probe overlying the right upper abdominal quadrant with selective cannulation opacification of the common bile duct. Evaluation of the common bile duct is degraded secondary to suboptimal opacification though there may be a filling defect in the distal aspect of the common bile duct.  Subsequent images demonstrate insufflation of a biliary sphincterotomy balloon within the mid and distal aspects of the bile duct.  IMPRESSION:  ERCP with potential findings of choledocholithiasis as above.  These images were submitted for radiologic interpretation only. Please see the procedural report for the amount of contrast and the fluoroscopy time utilized.   Electronically Signed   By: Sandi Mariscal M.D.   On: 12/05/2013 09:21    Scheduled Meds: . amLODipine  2.5 mg Oral Daily  . enoxaparin (LOVENOX) injection  40 mg Subcutaneous Q24H  . multivitamin with minerals  1 tablet Oral Daily  . pantoprazole  40 mg Oral Daily   Continuous Infusions: . sodium chloride 50 mL/hr at 12/05/13 1853   Antibiotics  Given (last 72 hours)   Date/Time Action Medication Dose Rate   12/04/13 1524 Given   cefTRIAXone (ROCEPHIN) 1 g in dextrose 5 % 50 mL IVPB 1 g 100 mL/hr   12/05/13 0745 Given   cefTRIAXone (ROCEPHIN) 1 g in dextrose 5 % 50 mL IVPB 1 g    12/05/13 1248 Given   cefTRIAXone (ROCEPHIN) 1 g in dextrose 5 % 50 mL IVPB 1 g 100 mL/hr      Active Problems:   Choledocholithiasis with obstruction   Choledocholithiasis   HTN (hypertension)    Time spent: 1min    [facility] Pager [phone]. If 7PM-7AM, please contact night-coverage at [email or web address], password [facility] 12/06/2013, 10:48 AM  LOS: 2 days

## 2013-12-06 NOTE — Transfer of Care (Signed)
Immediate Anesthesia Transfer of Care Note  Patient: Madeline Rodriguez  Procedure(s) Performed: Procedure(s): LAPAROSCOPIC CHOLECYSTECTOMY WITH INTRAOPERATIVE CHOLANGIOGRAM (N/A)  Patient Location: PACU  Anesthesia Type:General  Level of Consciousness: awake, oriented and patient cooperative  Airway & Oxygen Therapy: Patient Spontanous Breathing and Patient connected to face mask oxygen  Post-op Assessment: Report given to PACU RN, Post -op Vital signs reviewed and stable and Patient moving all extremities X 4  Post vital signs: stable  Complications: No apparent anesthesia complications

## 2013-12-06 NOTE — Op Note (Signed)
Preoperative Diagnosis:  Cholelithiasis and Choledocholithiasis   Postoprative Diagnosis: same  Procedure: Procedure(s): LAPAROSCOPIC CHOLECYSTECTOMY WITH INTRAOPERATIVE CHOLANGIOGRAM   Surgeon: Excell Seltzer T   Assistants: none  Anesthesia:  General endotracheal anesthesia  Indications: patient is a 80 year old female with known gallstones and long-standing intermittent episodes of abdominal pain who presented with severe abdominal pain and was found to have elevated LFTs with cholelithiasis and choledocholithiasis. Yesterday she underwent a successful ERCP and stone extraction and today he is feeling better with improving LFTs. We have recommended proceeding with laparoscopic cholecystectomy with cholangiogram. I discussed the indications for the procedure and risks with the patient's family detailed extensively elsewhere and she is in agreement.  Procedure Detail:  Patient was brought to the operating room, placed in supine position on the operating table, and general endotracheal anesthesia induced. She was already on broad-spectrum IV antibiotics. VAS were placed. The abdomen was widely sterilely prepped and draped. Patient timeout was performed and correct procedure verified. Trocar sites were infiltrated with local anesthesia. Access was obtained at the umbilicus with a 1/2 cm incision with an open Hassan technique through a mattress suture of 0 Vicryl without difficulty and pneumoperitoneum established. Under direct vision an 11 mm trocar was placed subxiphoid and 25 mm trochars along the right subcostal margin. The gallbladder was somewhat edematous and moderately chronically inflamed with omental adhesions. The fundus was grasped and elevated and omental adhesions were taken down with cautery and blunt dissection. The infundibulum was exposed and retracted inferolaterally. Peritoneum anterior and posterior to close triangle into the infundibulum was incised. Close triangle was  thoroughly dissected. The cystic duct and cystic artery were identified and dissected clear in a good critical view of safety achieved with the isolated cystic duct and cystic artery only and close triangle and the cholecystic plate visualized. When the anatomy was clear the cystic artery was singly clipped on the cystic duct was clipped at the gallbladder junction. An operative cholangiogram was obtained through the cystic duct which showed good filling of a somewhat dilated common bile duct and intrahepatic ducts. There was flow into the duodenum. There appeared to be some sludge and possibly one small stone in the distal bile duct. The patient was status post sphincterotomy. The cholangiocatheter was removed and the cystic duct triply clipped proximally and divided. The cystic artery was further clipped distally and divided. The gallbladder was then dissected free from its bed using hook cautery. There was chronic inflammation along the liver bed and some mild bleeding controlled with cautery. The gallbladder was detached and placed in an Endo Catch bag and brought out through the umbilicus. Complete hemostasis was obtained with cautery and a Surgicel pack. There was no bleeding or any evidence of trocar injury or other problems. All CO2 was evacuated and a mattress suture secured at the umbilicus and trochars removed. Skin incisions were closed with subcuticular Monocryl and Dermabond. Sponge needle and instrument counts were correct.  Findings:  As above  Estimated Blood Loss:  less than 100 mL         Drains: nnone  Blood Given: none          Specimens: gallbladder and contents        Complications:  * No complications entered in OR log *         Disposition: PACU - hemodynamically stable.         Condition: stable

## 2013-12-06 NOTE — Progress Notes (Signed)
Madeline Rodriguez 9:48 AM  Subjective: Patient doing well without obvious ERCP problem getting ready for laparoscopic cholecystectomy today  Objective: Vital signs stable afebrile abdomen is soft nontender labs improved except white count slightly increased  Assessment: Status post ERCP for CBD stone  Plan: Will be on standby to help when necessary and recheck liver tests as an outpatient just to make sure they are back to normal and I again recommended a repeat colonoscopy for her history of polyps in 3-6 months  [name]

## 2013-12-07 ENCOUNTER — Encounter (HOSPITAL_COMMUNITY): Payer: Self-pay | Admitting: Gastroenterology

## 2013-12-07 MED ORDER — SENNOSIDES-DOCUSATE SODIUM 8.6-50 MG PO TABS: 1.0000 | ORAL_TABLET | Freq: Every day | ORAL | Status: AC | PRN

## 2013-12-07 MED ORDER — HYDROCODONE-ACETAMINOPHEN 5-300 MG PO TABS: 1.0000 | ORAL_TABLET | Freq: Four times a day (QID) | ORAL | Status: DC | PRN

## 2013-12-07 NOTE — Progress Notes (Signed)
Patient ID: Madeline Rodriguez, female   DOB: [birth year], 80 y.o.   MRN: [id number]  General Surgery - [facility] - Progress Note  POD# 1  Subjective: Patient up in chair at bedside.  Ambulatory.  Tolerating clear liquid diet.  Pain controlled.  Anxious to go home today.  Objective: Vital signs in last 24 hours: Temp:  [97.4 F (36.3 C)-98.5 F (36.9 C)] 98.5 F (36.9 C) (04/27 0300) Pulse Rate:  [51-73] 73 (04/27 0623) Resp:  [10-18] 18 (04/27 0623) BP: (112-134)/(61-81) 118/62 mmHg (04/27 0623) SpO2:  [94 %-100 %] 94 % (04/27 0623)    Intake/Output from previous day: 04/26 0701 - 04/27 0700 In: 1960 [P.O.:360; I.V.:1600] Out: 750 [Urine:750]  Exam: HEENT - clear, not icteric Neck - soft Chest - clear bilaterally Cor - RRR, no murmur Abd - soft without distension; wounds clear and dry and intact Ext - no significant edema Neuro - grossly intact, no focal deficits  Lab Results:   Recent Labs  12/05/13 0547 12/06/13 0550  WBC 13.0* 14.9*  HGB 13.1 13.1  HCT 39.8 40.9  PLT 141* 197     Recent Labs  12/05/13 0547 12/06/13 0550  NA 142 143  K 4.0 4.2  CL 105 104  CO2 24 25  GLUCOSE 83 119*  BUN 19 18  CREATININE 0.88 0.81  CALCIUM 9.3 9.6    Studies/Results: Dg Cholangiogram Operative  12/06/2013   CLINICAL DATA:  Acute cholecystitis  EXAM: INTRAOPERATIVE CHOLANGIOGRAM  TECHNIQUE: Cholangiographic images from the C-arm fluoroscopic device were submitted for interpretation post-operatively. Please see the procedural report for the amount of contrast and the fluoroscopy time utilized.  COMPARISON:  Abdominal ultrasound 12/04/2013; the ERCP images 425 2015  FINDINGS: Single intraoperative cine loop obtained during intraoperative cholangiogram demonstrates catheterization of the cystic duct remnant and opacification of the intrahepatic and extrahepatic biliary tree. The ampulla is widely patent. Contrast material flows freely into the  duodenum. There is a mild dilute station of the common bile duct. No definite choledocholithiasis. No intrahepatic biliary ductal dilatation.  IMPRESSION: Intraoperative cholangiogram. No residual choledocholithiasis. The ampulla is widely patent.  Persistent mild dilatation of the extrahepatic bile duct.   Electronically Signed   By: Jacqulynn Cadet M.D.   On: 12/06/2013 12:05    Assessment / Plan: 1.  Status post lap chole with IOC  Advance diet to regular  OK for discharge home today per medical service  Will arrange follow up appt with Dr. Excell Seltzer at [facility] office in 2 weeks  Earnstine Regal, MD, [facility] Office: [phone]  12/07/2013

## 2013-12-07 NOTE — Discharge Summary (Signed)
Physician Discharge Summary  Madeline Rodriguez WGY:[id number] DOB: 01/05/1934 DOA: 12/04/2013  PCP: Birdena Crandall, MD  Admit date: 12/04/2013 Discharge date: 12/07/2013  Time spent: 45 minutes  Recommendations for Outpatient Follow-up:  1. Dr.Hoxworth in 2 weeks  Discharge Diagnoses:  Active Problems:   Choledocholithiasis with obstruction   Cholelithiasis   HTN (hypertension)   Discharge Condition: stable  Diet recommendation: bland diet, advance as tolerated  Filed Weights   12/04/13 1137  Weight: 86.093 kg (189 lb 12.8 oz)    History of present illness:  Madeline Rodriguez is a 80 y.o. female with PMH of HTN, RBBB, presents to the ER with the above complaints. She's had long standing intermittent epigastric abd pain which is usually self limiting.  But yesterday she ate a chicken sandwich and subsequently started experiencing severe epigastric and R sided and pain, which was much more severe and lasted for about 12 hours and hence decided to come to the ER today.  No diarrhea or vomiting, no fevers or chills.  In ER, noted to have Cholelithiasis, dilated CBD with suspected CBD stone   Hospital Course:  1. Cholelithiasis with choledocholithiasis/CBD obstruction  -s/p ERCPwith sphincterotomy/papillotomy and removal of calculus/calculi 4/25 -subsequently underwent Lap Cholecystectomy 4/26 -clinically improved and stable for discharge from Surgical standpoint -Fu with Dr.Hoxworth in 2 weeks  2. HTN  -continue norvasc   3. RBBB  -chronic, stable    Procedures:  4/25: ERCP with sphincterotomy/papillotomy and ERCP with  removal of calculus/calculi    Lap CHolecystectomy 4/26  Consultations:  GI  CCS  Discharge Exam: Filed Vitals:   12/07/13 0958  BP: 126/71  Pulse: 74  Temp:   Resp:     General:AAOx3, no distress Cardiovascular: S1S2/RRR Respiratory: CTAB  Discharge Instructions You were cared for by a hospitalist during your hospital  stay. If you have any questions about your discharge medications or the care you received while you were in the hospital after you are discharged, you can call the unit and asked to speak with the hospitalist on call if the hospitalist that took care of you is not available. Once you are discharged, your primary care physician will handle any further medical issues. Please note that NO REFILLS for any discharge medications will be authorized once you are discharged, as it is imperative that you return to your primary care physician (or establish a relationship with a primary care physician if you do not have one) for your aftercare needs so that they can reassess your need for medications and monitor your lab values.  Discharge Orders   Future Orders Complete By Expires   Discharge instructions  As directed    Increase activity slowly  As directed        Medication List         acetaminophen 500 MG tablet  Commonly known as:  TYLENOL  Take 500 mg by mouth at bedtime as needed (For pain).     amLODipine 5 MG tablet  Commonly known as:  NORVASC  Take 2.5 mg by mouth 4 (four) times daily.     aspirin 81 MG tablet  Take 81 mg by mouth every evening.     CALTRATE 600 PLUS-VIT D PO  Take 2 tablets by mouth daily.     esomeprazole 20 MG capsule  Commonly known as:  NEXIUM  Take 20 mg by mouth daily at 12 noon.     glucosamine-chondroitin 500-400 MG tablet  Take 2 tablets by mouth daily.  Hydrocodone-Acetaminophen 5-300 MG Tabs  Commonly known as:  VICODIN  Take 1-2 tablets by mouth every 6 (six) hours as needed.     multivitamin with minerals Tabs tablet  Take 1 tablet by mouth daily.     Omega 3 1200 MG Caps  Take 1,200 mg by mouth daily.     psyllium 0.52 G capsule  Commonly known as:  REGULOID  Take 4 capsules by mouth daily.     senna-docusate 8.6-50 MG per tablet  Commonly known as:  Senokot-S  Take 1 tablet by mouth daily as needed for mild constipation.        Allergies  Allergen Reactions  . Tape Other (See Comments)    Only use paper        Follow-up Information   Follow up with HOXWORTH,BENJAMIN T, MD. Schedule an appointment as soon as possible for a visit in 2 weeks.   Specialty:  General Surgery   Contact information:   [address] [place] [address] [phone]        The results of significant diagnostics from this hospitalization (including imaging, microbiology, ancillary and laboratory) are listed below for reference.    Significant Diagnostic Studies: Dg Cholangiogram Operative  12/06/2013   CLINICAL DATA:  Acute cholecystitis  EXAM: INTRAOPERATIVE CHOLANGIOGRAM  TECHNIQUE: Cholangiographic images from the C-arm fluoroscopic device were submitted for interpretation post-operatively. Please see the procedural report for the amount of contrast and the fluoroscopy time utilized.  COMPARISON:  Abdominal ultrasound 12/04/2013; the ERCP images 425 2015  FINDINGS: Single intraoperative cine loop obtained during intraoperative cholangiogram demonstrates catheterization of the cystic duct remnant and opacification of the intrahepatic and extrahepatic biliary tree. The ampulla is widely patent. Contrast material flows freely into the duodenum. There is a mild dilute station of the common bile duct. No definite choledocholithiasis. No intrahepatic biliary ductal dilatation.  IMPRESSION: Intraoperative cholangiogram. No residual choledocholithiasis. The ampulla is widely patent.  Persistent mild dilatation of the extrahepatic bile duct.   Electronically Signed   By: Jacqulynn Cadet M.D.   On: 12/06/2013 12:05   US Abdomen Complete  12/04/2013   CLINICAL DATA:  Abdominal pain  EXAM: ULTRASOUND ABDOMEN COMPLETE  COMPARISON:  06/19/2010  FINDINGS: Gallbladder:  Multiple gallstones are identified. No gallbladder wall thickening is noted. A negative sonographic Percell Miller sign is noted.  Common bile duct:  Diameter: 13 mm  Liver:   No focal lesion identified. Within normal limits in parenchymal echogenicity.  IVC:  No abnormality visualized.  Pancreas:  Visualized portion unremarkable.  Spleen:  Size and appearance within normal limits.  Right Kidney:  Length: 10.6 cm. A 1.2 cm parapelvic cyst is seen. Echogenicity within normal limits. No mass or hydronephrosis visualized.  Left Kidney:  Length: 11.6 cm.  Mild hydronephrosis is noted.  Abdominal aorta:  No aneurysm visualized.  Other findings:  Ureteral jet is noted on the left.  IMPRESSION: Cholelithiasis.  Dilated common bile duct which may be related to a distal common bile duct stone. Further evaluation is recommended.  Left-sided hydronephrosis which persists following voiding. A left ureteral jet is noted. This is of uncertain etiology.   Electronically Signed   By: Inez Catalina M.D.   On: 12/04/2013 09:39   Dg Ercp Biliary & Pancreatic Ducts  12/05/2013   CLINICAL DATA:  Gallstones  EXAM: ERCP  TECHNIQUE: Multiple spot images obtained with the fluoroscopic device and submitted for interpretation post-procedure.  FLUOROSCOPY TIME:  5 min, 5 seconds  COMPARISON:  US ABDOMEN COMPLETE dated 12/04/2013  FINDINGS: Three spot intraoperative fluoroscopic images during ERCP are provided for review.  Initial image demonstrates an ERCP probe overlying the right upper abdominal quadrant with selective cannulation opacification of the common bile duct. Evaluation of the common bile duct is degraded secondary to suboptimal opacification though there may be a filling defect in the distal aspect of the common bile duct.  Subsequent images demonstrate insufflation of a biliary sphincterotomy balloon within the mid and distal aspects of the bile duct.  IMPRESSION: ERCP with potential findings of choledocholithiasis as above.  These images were submitted for radiologic interpretation only. Please see the procedural report for the amount of contrast and the fluoroscopy time utilized.   Electronically  Signed   By: Sandi Mariscal M.D.   On: 12/05/2013 09:21    Microbiology: No results found for this or any previous visit (from the past 240 hour(s)).   Labs: Basic Metabolic Panel:  Recent Labs Lab 12/04/13 0838 12/05/13 0547 12/06/13 0550  NA 136* 142 143  K 3.5* 4.0 4.2  CL 97 105 104  CO2 22 24 25   GLUCOSE 244* 83 119*  BUN 21 19 18   CREATININE 0.80 0.88 0.81  CALCIUM 10.1 9.3 9.6   Liver Function Tests:  Recent Labs Lab 12/04/13 0838 12/05/13 0547 12/06/13 0550  AST 966* 262* 92*  ALT 642* 357* 239*  ALKPHOS 168* 130* 138*  BILITOT 2.7* 2.6* 0.9  PROT 7.3 6.1 6.8  ALBUMIN 4.1 3.3* 3.2*    Recent Labs Lab 12/04/13 0838 12/06/13 0550  LIPASE 19 19   No results found for this basename: AMMONIA,  in the last 168 hours CBC:  Recent Labs Lab 12/04/13 0838 12/05/13 0547 12/06/13 0550  WBC 11.3* 13.0* 14.9*  NEUTROABS 10.8*  --  13.3*  HGB 13.9 13.1 13.1  HCT 42.4 39.8 40.9  MCV 85.7 87.3 89.1  PLT 217 141* 197   Cardiac Enzymes: No results found for this basename: CKTOTAL, CKMB, CKMBINDEX, TROPONINI,  in the last 168 hours BNP: BNP (last 3 results) No results found for this basename: PROBNP,  in the last 8760 hours CBG:  Recent Labs Lab 12/04/13 2125 12/06/13 1144  GLUCAP 105* 135*       Signed:  Domenic Polite  [facility] 12/07/2013, 11:03 AM

## 2013-12-10 ENCOUNTER — Telehealth (INDEPENDENT_AMBULATORY_CARE_PROVIDER_SITE_OTHER): Payer: Self-pay

## 2013-12-10 NOTE — Telephone Encounter (Signed)
Called and spoke to patient regarding new post op date & time for 12/31/13 @ 12:20pm w/Dr. Excell Seltzer.

## 2013-12-10 NOTE — Telephone Encounter (Signed)
Message copied by Ivor Costa on Thu Dec 10, 2013  4:02 PM ------      Message from: Joya San      Created: Wed Dec 09, 2013  9:31 AM      Contact: [phone]       Pt had emergency sx Sunday 04/26. I made her an appt for 01/15/14 is this too far out? Pt is doing well no problems she stated. ------

## 2013-12-31 ENCOUNTER — Ambulatory Visit (INDEPENDENT_AMBULATORY_CARE_PROVIDER_SITE_OTHER): Payer: Federal, State, Local not specified - PPO | Admitting: General Surgery

## 2013-12-31 ENCOUNTER — Encounter (INDEPENDENT_AMBULATORY_CARE_PROVIDER_SITE_OTHER): Payer: Self-pay | Admitting: General Surgery

## 2013-12-31 VITALS — BP 140/86 | HR 80 | Temp 97.1°F | Resp 16 | Wt 180.4 lb

## 2013-12-31 DIAGNOSIS — Z09 Encounter for follow-up examination after completed treatment for conditions other than malignant neoplasm: Secondary | ICD-10-CM

## 2013-12-31 NOTE — Progress Notes (Signed)
Chief complaint: Followup cholecystectomy and ERCP and stone extraction  History: Patient returns approaching 1 month following emergency laparoscopic cholecystectomy with cholangiogram which he presented with elevated LFTs and choledocholithiasis. She underwent preoperative ERCP and stone extraction by Dr. Watt Climes.  She reports that since discharge she has been feeling well with no recurrent abdominal pain or any other GI complaints.  Exam: BP 140/86  Pulse 80  Temp(Src) 97.1 F (36.2 C) (Temporal)  Resp 16  Wt 180 lb 6.4 oz (81.829 kg) General: Appears well Abdomen: Soft and nontender. Wounds all well healed  Pathology: Gallbladder - CHRONIC CHOLECYSTITIS AND CHOLELITHIASIS  Assessment and plan: doing well following above procedures with no evidence of complications. She's discharged to return as needed.

## 2014-01-15 ENCOUNTER — Encounter (INDEPENDENT_AMBULATORY_CARE_PROVIDER_SITE_OTHER): Payer: Federal, State, Local not specified - PPO | Admitting: General Surgery

## 2014-05-13 ENCOUNTER — Other Ambulatory Visit: Payer: Self-pay

## 2014-05-13 DIAGNOSIS — Z1231 Encounter for screening mammogram for malignant neoplasm of breast: Secondary | ICD-10-CM

## 2014-06-10 ENCOUNTER — Ambulatory Visit
Admission: RE | Admit: 2014-06-10 | Discharge: 2014-06-10 | Disposition: A | Discharge status: Home / Self Care | Payer: Federal, State, Local not specified - PPO | Source: Ambulatory Visit

## 2014-06-10 DIAGNOSIS — Z1231 Encounter for screening mammogram for malignant neoplasm of breast: Secondary | ICD-10-CM

## 2015-05-16 ENCOUNTER — Other Ambulatory Visit: Payer: Self-pay

## 2015-05-16 DIAGNOSIS — Z1231 Encounter for screening mammogram for malignant neoplasm of breast: Secondary | ICD-10-CM

## 2015-06-14 ENCOUNTER — Ambulatory Visit
Admission: RE | Admit: 2015-06-14 | Discharge: 2015-06-14 | Disposition: A | Discharge status: Home / Self Care | Payer: Federal, State, Local not specified - PPO | Source: Ambulatory Visit

## 2015-06-14 DIAGNOSIS — Z1231 Encounter for screening mammogram for malignant neoplasm of breast: Secondary | ICD-10-CM

## 2016-05-15 ENCOUNTER — Other Ambulatory Visit: Payer: Self-pay | Admitting: Family Medicine

## 2016-05-15 DIAGNOSIS — Z1231 Encounter for screening mammogram for malignant neoplasm of breast: Secondary | ICD-10-CM

## 2016-06-14 ENCOUNTER — Ambulatory Visit: Payer: Federal, State, Local not specified - PPO

## 2016-06-21 ENCOUNTER — Ambulatory Visit
Admission: RE | Admit: 2016-06-21 | Discharge: 2016-06-21 | Disposition: A | Discharge status: Home / Self Care | Payer: Federal, State, Local not specified - PPO | Source: Ambulatory Visit | Attending: Family Medicine | Admitting: Family Medicine

## 2016-06-21 DIAGNOSIS — Z1231 Encounter for screening mammogram for malignant neoplasm of breast: Secondary | ICD-10-CM

## 2017-06-19 ENCOUNTER — Other Ambulatory Visit: Payer: Self-pay | Admitting: Family Medicine

## 2017-06-19 DIAGNOSIS — Z1231 Encounter for screening mammogram for malignant neoplasm of breast: Secondary | ICD-10-CM

## 2017-07-18 ENCOUNTER — Ambulatory Visit: Payer: Federal, State, Local not specified - PPO

## 2017-07-19 ENCOUNTER — Ambulatory Visit: Payer: Federal, State, Local not specified - PPO

## 2017-08-19 ENCOUNTER — Ambulatory Visit
Admission: RE | Admit: 2017-08-19 | Discharge: 2017-08-19 | Disposition: A | Discharge status: Home / Self Care | Payer: Federal, State, Local not specified - PPO | Source: Ambulatory Visit | Attending: Family Medicine | Admitting: Family Medicine

## 2017-08-19 DIAGNOSIS — Z1231 Encounter for screening mammogram for malignant neoplasm of breast: Secondary | ICD-10-CM

## 2018-07-24 ENCOUNTER — Other Ambulatory Visit: Payer: Self-pay | Admitting: Family Medicine

## 2018-07-24 DIAGNOSIS — Z1231 Encounter for screening mammogram for malignant neoplasm of breast: Secondary | ICD-10-CM

## 2018-09-03 ENCOUNTER — Ambulatory Visit: Payer: Federal, State, Local not specified - PPO

## 2018-09-19 ENCOUNTER — Ambulatory Visit
Admission: RE | Admit: 2018-09-19 | Discharge: 2018-09-19 | Disposition: A | Discharge status: Home / Self Care | Payer: Federal, State, Local not specified - PPO | Source: Ambulatory Visit | Attending: Family Medicine | Admitting: Family Medicine

## 2018-09-19 DIAGNOSIS — Z1231 Encounter for screening mammogram for malignant neoplasm of breast: Secondary | ICD-10-CM

## 2019-09-28 ENCOUNTER — Ambulatory Visit: Payer: Federal, State, Local not specified - PPO | Attending: Internal Medicine

## 2019-09-28 ENCOUNTER — Other Ambulatory Visit: Payer: Self-pay

## 2019-09-28 DIAGNOSIS — Z23 Encounter for immunization: Secondary | ICD-10-CM

## 2019-09-28 NOTE — Progress Notes (Signed)
   Covid-19 Vaccination Clinic  Name:  Madeline Rodriguez    MRN: [id number] DOB: [birth year]  09/28/2019  Madeline Rodriguez was observed post Covid-19 immunization for 15 minutes without incidence. She was provided with Vaccine Information Sheet and instruction to access the V-Safe system.   Madeline Rodriguez was instructed to call 911 with any severe reactions post vaccine: Marland Kitchen Difficulty breathing  . Swelling of your face and throat  . A fast heartbeat  . A bad rash all over your body  . Dizziness and weakness    Immunizations Administered    Name Date Dose VIS Date Route   Moderna COVID-19 Vaccine 09/28/2019  9:58 AM 0.5 mL 07/14/2019 Intramuscular   Manufacturer: Moderna   Lot: [id number]   [id number]

## 2019-10-27 ENCOUNTER — Ambulatory Visit: Payer: Federal, State, Local not specified - PPO | Attending: Internal Medicine

## 2019-10-27 DIAGNOSIS — Z23 Encounter for immunization: Secondary | ICD-10-CM

## 2019-10-27 NOTE — Progress Notes (Signed)
   Covid-19 Vaccination Clinic  Name:  Madeline Rodriguez    MRN: [id number] DOB: Mar 20, 1934  10/27/2019  Madeline Rodriguez was observed post Covid-19 immunization for 15 minutes without incident. She was provided with Vaccine Information Sheet and instruction to access the V-Safe system.   Madeline Rodriguez was instructed to call 911 with any severe reactions post vaccine: Marland Kitchen Difficulty breathing  . Swelling of face and throat  . A fast heartbeat  . A bad rash all over body  . Dizziness and weakness   Immunizations Administered    Name Date Dose VIS Date Route   Moderna COVID-19 Vaccine 10/27/2019  9:52 AM 0.5 mL 07/14/2019 Intramuscular   Manufacturer: Moderna   Lot: [id number]   [id number]

## 2019-11-26 ENCOUNTER — Other Ambulatory Visit: Payer: Self-pay | Admitting: Family Medicine

## 2019-11-26 DIAGNOSIS — Z1231 Encounter for screening mammogram for malignant neoplasm of breast: Secondary | ICD-10-CM

## 2019-12-09 ENCOUNTER — Ambulatory Visit: Payer: Federal, State, Local not specified - PPO

## 2020-01-06 ENCOUNTER — Ambulatory Visit
Admission: RE | Admit: 2020-01-06 | Discharge: 2020-01-06 | Disposition: A | Discharge status: Home / Self Care | Payer: Federal, State, Local not specified - PPO | Source: Ambulatory Visit | Attending: Family Medicine | Admitting: Family Medicine

## 2020-01-06 ENCOUNTER — Other Ambulatory Visit: Payer: Self-pay

## 2020-01-06 DIAGNOSIS — Z1231 Encounter for screening mammogram for malignant neoplasm of breast: Secondary | ICD-10-CM

## 2020-04-01 ENCOUNTER — Other Ambulatory Visit: Payer: Self-pay | Admitting: Family Medicine

## 2020-04-01 ENCOUNTER — Ambulatory Visit
Admission: RE | Admit: 2020-04-01 | Discharge: 2020-04-01 | Disposition: A | Discharge status: Home / Self Care | Payer: Federal, State, Local not specified - PPO | Source: Ambulatory Visit | Attending: Family Medicine | Admitting: Family Medicine

## 2020-04-01 DIAGNOSIS — S8011XS Contusion of right lower leg, sequela: Secondary | ICD-10-CM

## 2020-04-01 DIAGNOSIS — R52 Pain, unspecified: Secondary | ICD-10-CM

## 2020-04-06 ENCOUNTER — Other Ambulatory Visit: Payer: Self-pay | Admitting: Family Medicine

## 2020-04-06 DIAGNOSIS — M810 Age-related osteoporosis without current pathological fracture: Secondary | ICD-10-CM

## 2020-04-26 ENCOUNTER — Other Ambulatory Visit: Payer: Self-pay | Admitting: Family Medicine

## 2020-04-26 ENCOUNTER — Ambulatory Visit
Admission: RE | Admit: 2020-04-26 | Discharge: 2020-04-26 | Disposition: A | Discharge status: Home / Self Care | Payer: Federal, State, Local not specified - PPO | Source: Ambulatory Visit | Attending: Family Medicine | Admitting: Family Medicine

## 2020-04-26 DIAGNOSIS — M2540 Effusion, unspecified joint: Secondary | ICD-10-CM

## 2020-07-21 ENCOUNTER — Other Ambulatory Visit: Payer: Federal, State, Local not specified - PPO

## 2020-11-17 ENCOUNTER — Ambulatory Visit
Admission: RE | Admit: 2020-11-17 | Discharge: 2020-11-17 | Disposition: A | Discharge status: Home / Self Care | Payer: Federal, State, Local not specified - PPO | Source: Ambulatory Visit | Attending: Family Medicine | Admitting: Family Medicine

## 2020-11-17 ENCOUNTER — Other Ambulatory Visit: Payer: Self-pay | Admitting: Family Medicine

## 2020-11-17 ENCOUNTER — Other Ambulatory Visit: Payer: Self-pay

## 2020-11-17 DIAGNOSIS — R2242 Localized swelling, mass and lump, left lower limb: Secondary | ICD-10-CM

## 2020-12-08 ENCOUNTER — Ambulatory Visit: Payer: Federal, State, Local not specified - PPO | Admitting: Podiatry

## 2020-12-08 ENCOUNTER — Other Ambulatory Visit: Payer: Self-pay

## 2020-12-08 ENCOUNTER — Encounter: Payer: Self-pay | Admitting: Podiatry

## 2020-12-08 DIAGNOSIS — L84 Corns and callosities: Secondary | ICD-10-CM | POA: Diagnosis not present

## 2020-12-08 DIAGNOSIS — D169 Benign neoplasm of bone and articular cartilage, unspecified: Secondary | ICD-10-CM | POA: Diagnosis not present

## 2020-12-08 NOTE — Progress Notes (Signed)
Subjective:   Patient ID: Madeline Rodriguez, female   DOB: 87 y.o.   MRN: [id number]   HPI Patient states she is concerned about the left fifth toe stating that its been sore and that she was referred to [place].  States that she does not remember injury and its been present for around 4 months.  Patient does not smoke likes to be active   Review of Systems  All other systems reviewed and are negative.       Objective:  Physical Exam Vitals and nursing note reviewed.  Constitutional:      Appearance: She is well-developed.  Pulmonary:     Effort: Pulmonary effort is normal.  Musculoskeletal:        General: Normal range of motion.  Skin:    General: Skin is warm.  Neurological:     Mental Status: She is alert.     Neurovascular status found to be intact muscle strength found to be adequate with patient found to have a keratotic lesion on the inner side of the fifth digit left painful when pressed with moderate rotation of the toe as pressure occurs between the 2 digits.  Good digital perfusion well oriented     Assessment:  Exostotic lesion digit 5 left with rotational component of the toe and keratotic tissue formation     Plan:  H&P reviewed x-ray from previous physician and educated her on condition and treatment.  At this point I went ahead I debrided the lesion I applied padding I educated her on exostectomy and the possibility for surgical intervention in this case and what recovery would look like.  Patient will be seen back as symptoms indicate

## 2021-02-02 ENCOUNTER — Other Ambulatory Visit: Payer: Self-pay | Admitting: Family Medicine

## 2021-02-02 DIAGNOSIS — Z1231 Encounter for screening mammogram for malignant neoplasm of breast: Secondary | ICD-10-CM

## 2021-03-29 ENCOUNTER — Other Ambulatory Visit: Payer: Self-pay

## 2021-03-29 ENCOUNTER — Ambulatory Visit
Admission: RE | Admit: 2021-03-29 | Discharge: 2021-03-29 | Disposition: A | Discharge status: Home / Self Care | Payer: Federal, State, Local not specified - PPO | Source: Ambulatory Visit | Attending: Family Medicine | Admitting: Family Medicine

## 2021-03-29 DIAGNOSIS — Z1231 Encounter for screening mammogram for malignant neoplasm of breast: Secondary | ICD-10-CM

## 2022-03-19 ENCOUNTER — Other Ambulatory Visit: Payer: Self-pay | Admitting: Family Medicine

## 2022-03-19 DIAGNOSIS — Z1231 Encounter for screening mammogram for malignant neoplasm of breast: Secondary | ICD-10-CM

## 2022-04-06 ENCOUNTER — Ambulatory Visit: Payer: Federal, State, Local not specified - PPO

## 2022-04-25 ENCOUNTER — Ambulatory Visit: Payer: Federal, State, Local not specified - PPO

## 2023-03-26 IMAGING — MG MM DIGITAL SCREENING BILAT W/ TOMO AND CAD
8 series · 8 of 24 positions shown · non-contrast
Comparison: Previous exam(s).

CLINICAL DATA: Screening.

EXAM:
DIGITAL SCREENING BILATERAL MAMMOGRAM WITH TOMOSYNTHESIS AND CAD
TECHNIQUE: Bilateral screening digital craniocaudal and mediolateral oblique
mammograms were obtained. Bilateral screening digital breast
tomosynthesis was performed. The images were evaluated with
computer-aided detection.

[R CC synth-2D]
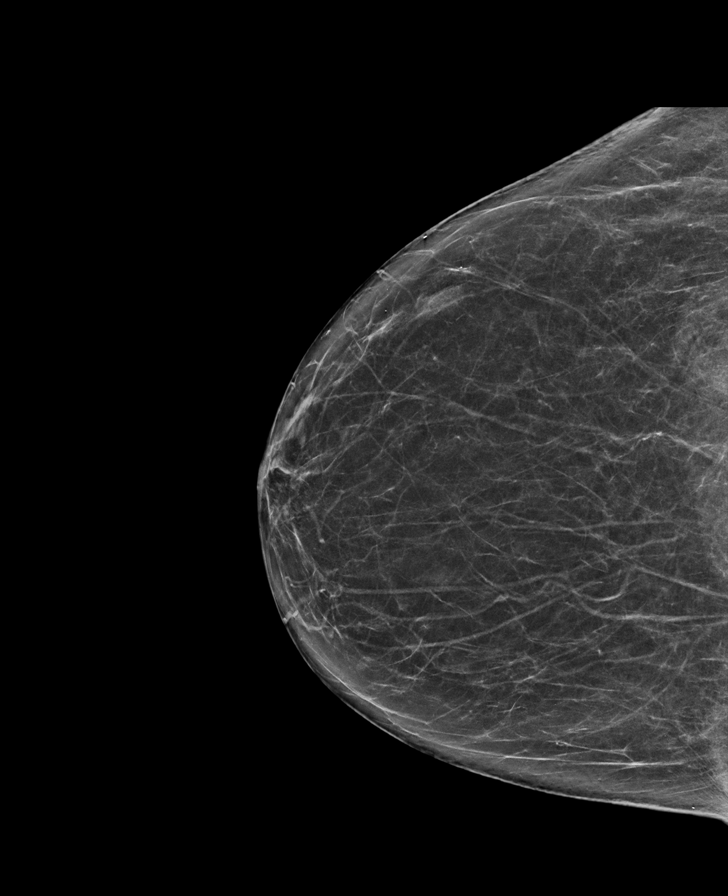

[L CC synth-2D]
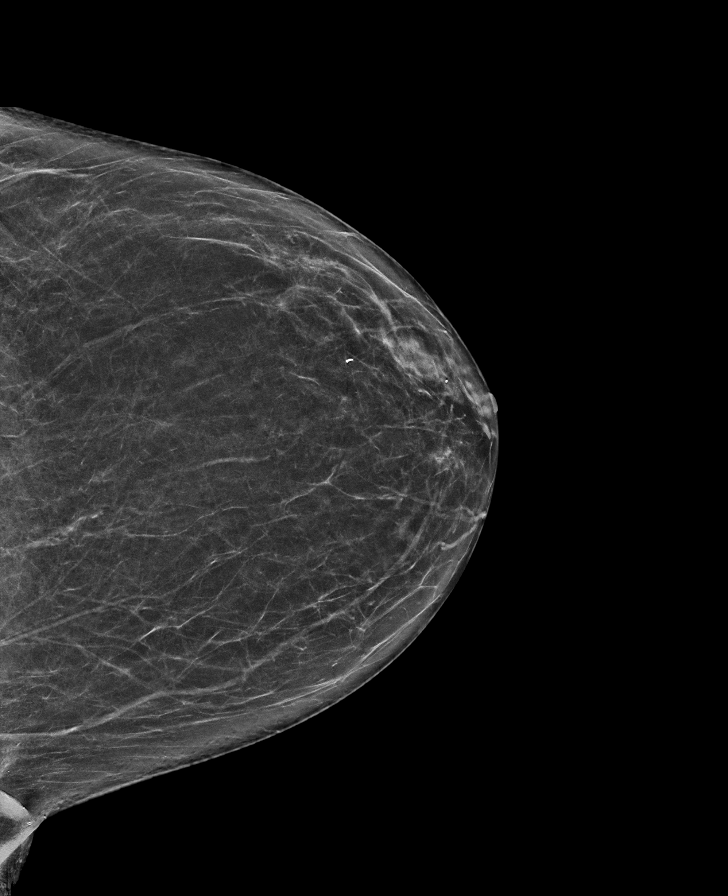

[L MLO synth-2D]
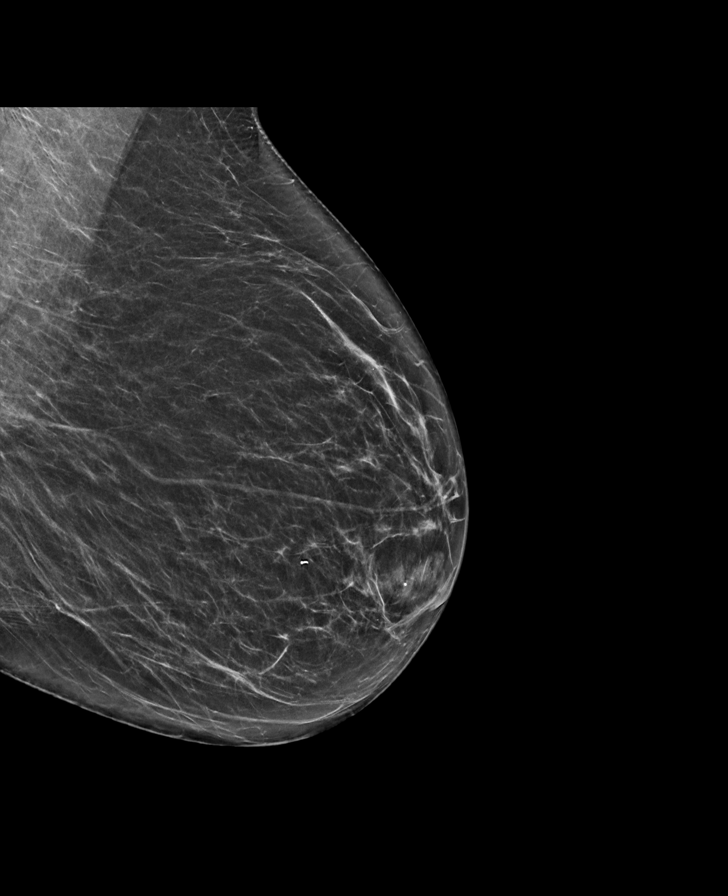

[R MLO synth-2D]
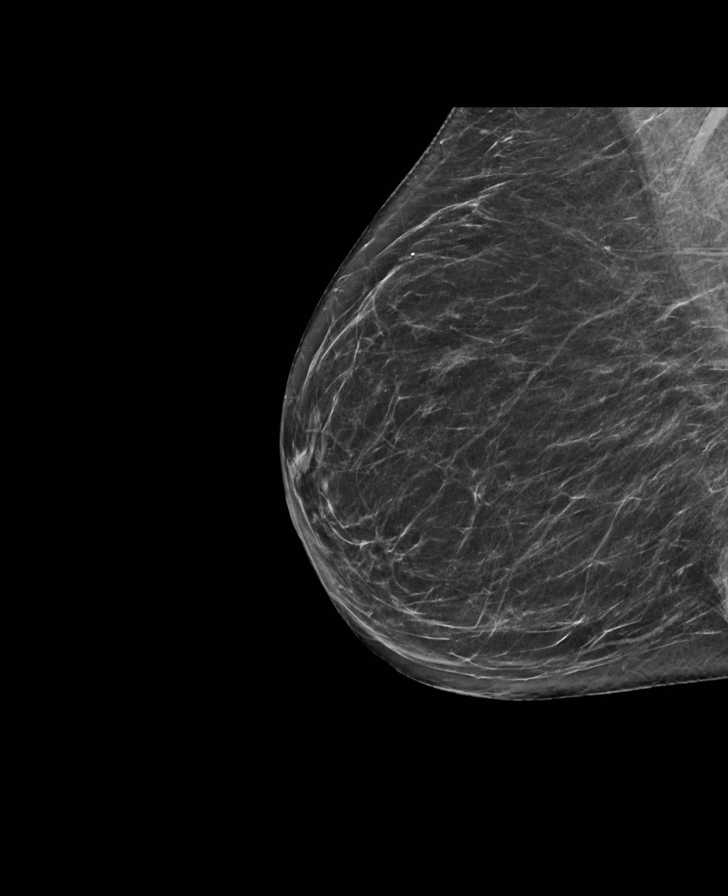

[L CC tomo · tomo slice 34/67.0]
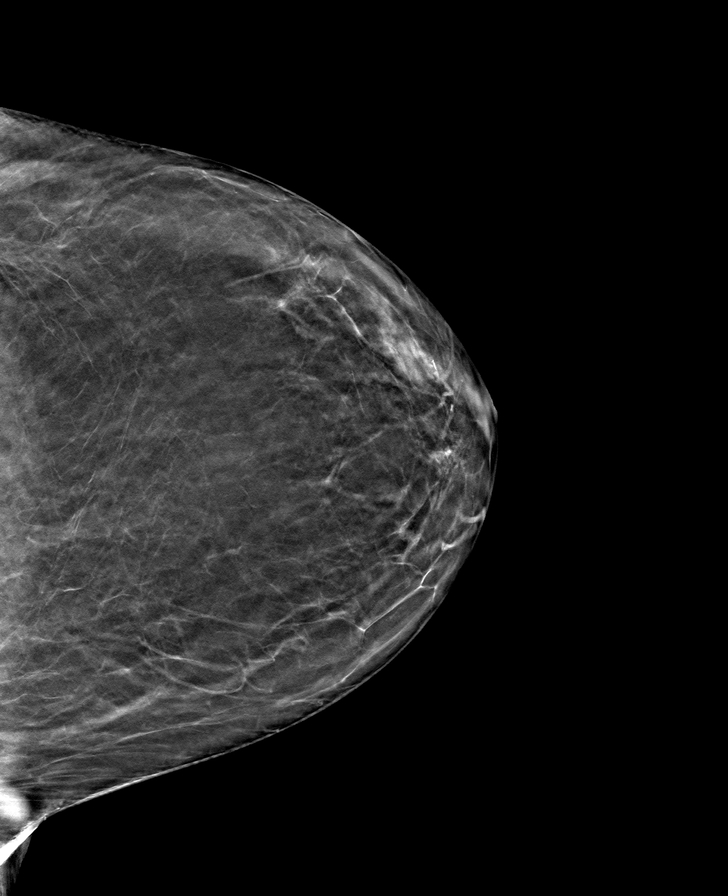

[R MLO tomo · tomo slice 35/68.0]
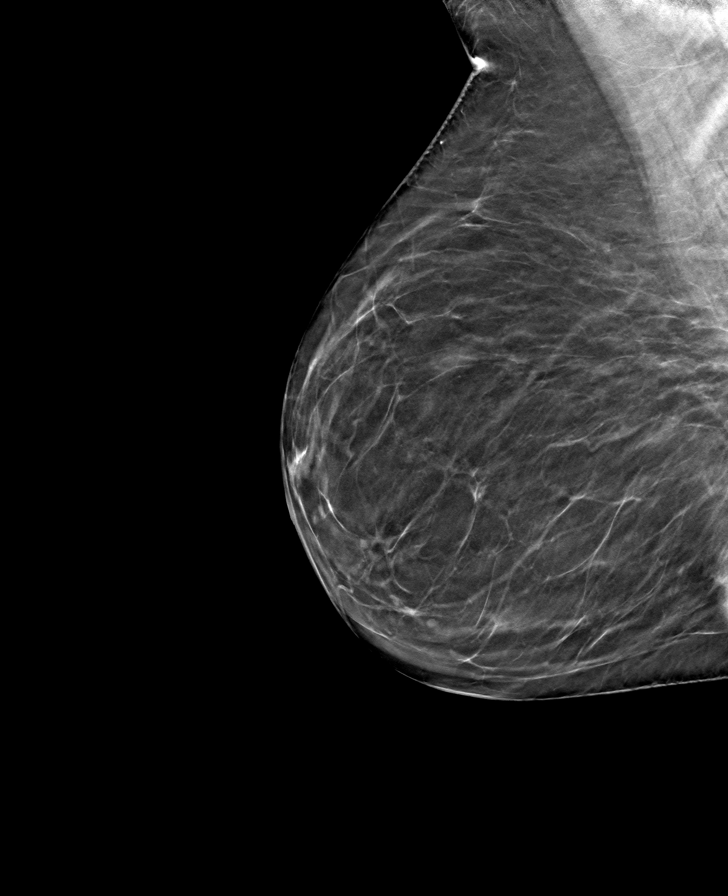

[R CC tomo · tomo slice 34/67.0]
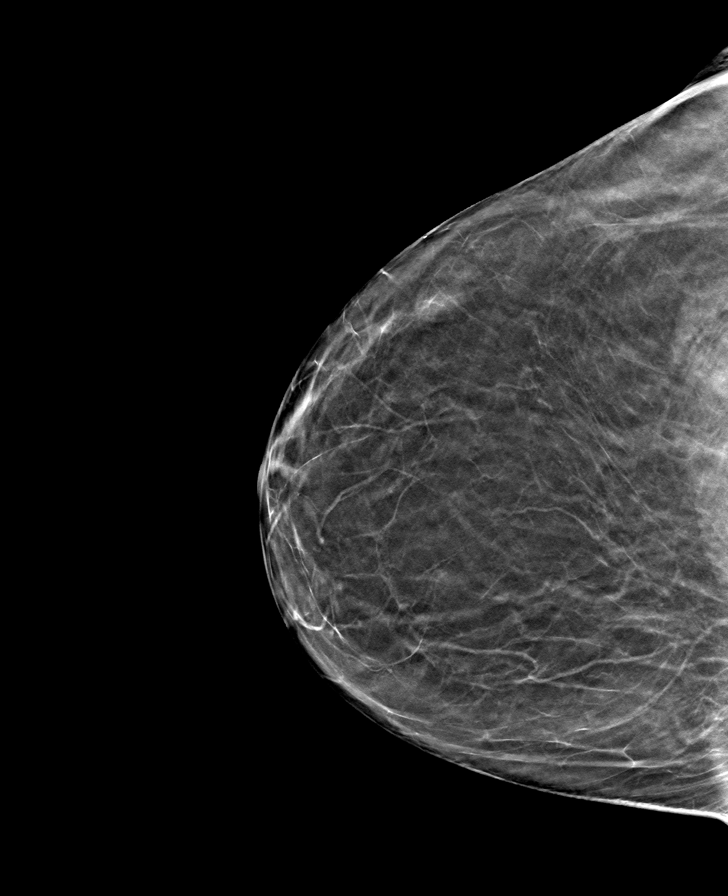

[L MLO tomo · tomo slice 37/72.0]
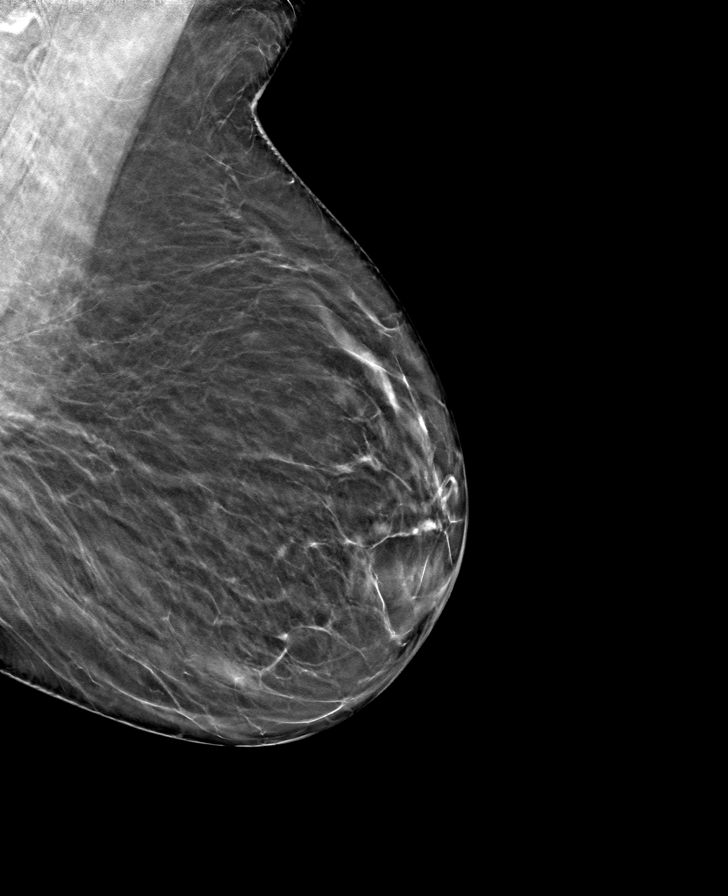

[8 of 24 positions shown; findings below may reference images not displayed]

ACR Breast Density Category b: There are scattered areas of
fibroglandular density.
FINDINGS: There are no findings suspicious for malignancy.
IMPRESSION: No mammographic evidence of malignancy. A result letter of this
screening mammogram will be mailed directly to the patient.

RECOMMENDATION:
Screening mammogram in one year. (Code:51-O-LD2)

BI-RADS CATEGORY  1: Negative.
